# Patient Record
Sex: Male | Born: 1965 | Race: White | Hispanic: No | Marital: Single | Smoking: Never smoker
Health system: Southern US, Community
[De-identification: ages and names within clinical notes are randomized; demographics above are authoritative.]

## PROBLEM LIST (undated history)

## (undated) DIAGNOSIS — E7800 Pure hypercholesterolemia, unspecified: Secondary | ICD-10-CM

## (undated) DIAGNOSIS — I10 Essential (primary) hypertension: Secondary | ICD-10-CM

## (undated) DIAGNOSIS — I639 Cerebral infarction, unspecified: Secondary | ICD-10-CM

## (undated) HISTORY — PX: NO PAST SURGERIES: SHX2092

---

## 2014-06-26 ENCOUNTER — Observation Stay (HOSPITAL_BASED_OUTPATIENT_CLINIC_OR_DEPARTMENT_OTHER)
Admission: EM | Admit: 2014-06-26 | Discharge: 2014-06-29 | Disposition: A | Discharge status: Home / Self Care | Payer: Self-pay | Attending: Family Medicine | Admitting: Family Medicine

## 2014-06-26 ENCOUNTER — Encounter (HOSPITAL_BASED_OUTPATIENT_CLINIC_OR_DEPARTMENT_OTHER): Payer: Self-pay | Admitting: Emergency Medicine

## 2014-06-26 ENCOUNTER — Observation Stay (HOSPITAL_COMMUNITY): Payer: Self-pay

## 2014-06-26 DIAGNOSIS — I10 Essential (primary) hypertension: Principal | ICD-10-CM | POA: Insufficient documentation

## 2014-06-26 DIAGNOSIS — R079 Chest pain, unspecified: Secondary | ICD-10-CM | POA: Insufficient documentation

## 2014-06-26 DIAGNOSIS — E876 Hypokalemia: Secondary | ICD-10-CM | POA: Insufficient documentation

## 2014-06-26 DIAGNOSIS — I160 Hypertensive urgency: Secondary | ICD-10-CM | POA: Diagnosis present

## 2014-06-26 HISTORY — DX: Essential (primary) hypertension: I10

## 2014-06-26 HISTORY — DX: Pure hypercholesterolemia, unspecified: E78.00

## 2014-06-26 LAB — CBC WITH DIFFERENTIAL/PLATELET
Basophils Absolute: 0.1 10*3/uL (ref 0.0–0.1)
Basophils Relative: 1 % (ref 0–1)
EOS ABS: 0.1 10*3/uL (ref 0.0–0.7)
Eosinophils Relative: 1 % (ref 0–5)
HCT: 48.4 % (ref 39.0–52.0)
Hemoglobin: 17 g/dL (ref 13.0–17.0)
LYMPHS ABS: 2.9 10*3/uL (ref 0.7–4.0)
Lymphocytes Relative: 29 % (ref 12–46)
MCH: 30.2 pg (ref 26.0–34.0)
MCHC: 35.1 g/dL (ref 30.0–36.0)
MCV: 86 fL (ref 78.0–100.0)
Monocytes Absolute: 1 10*3/uL (ref 0.1–1.0)
Monocytes Relative: 10 % (ref 3–12)
NEUTROS ABS: 6.1 10*3/uL (ref 1.7–7.7)
NEUTROS PCT: 61 % (ref 43–77)
PLATELETS: 309 10*3/uL (ref 150–400)
RBC: 5.63 MIL/uL (ref 4.22–5.81)
RDW: 12.5 % (ref 11.5–15.5)
WBC: 10 10*3/uL (ref 4.0–10.5)

## 2014-06-26 LAB — COMPREHENSIVE METABOLIC PANEL
ALK PHOS: 89 U/L (ref 39–117)
ALT: 30 U/L (ref 0–53)
AST: 26 U/L (ref 0–37)
Albumin: 4.7 g/dL (ref 3.5–5.2)
Anion gap: 18 — ABNORMAL HIGH (ref 5–15)
BUN: 10 mg/dL (ref 6–23)
CHLORIDE: 97 meq/L (ref 96–112)
CO2: 25 mEq/L (ref 19–32)
Calcium: 10.3 mg/dL (ref 8.4–10.5)
Creatinine, Ser: 0.9 mg/dL (ref 0.50–1.35)
GFR calc non Af Amer: >90 mL/min (ref >90)
GLUCOSE: 105 mg/dL — AB (ref 70–99)
POTASSIUM: 3.4 meq/L — AB (ref 3.7–5.3)
SODIUM: 140 meq/L (ref 137–147)
Total Bilirubin: 0.4 mg/dL (ref 0.3–1.2)
Total Protein: 8.8 g/dL — ABNORMAL HIGH (ref 6.0–8.3)

## 2014-06-26 LAB — TROPONIN I: Troponin I: <0.3 ng/mL (ref <0.30)

## 2014-06-26 MED ORDER — LABETALOL HCL 5 MG/ML IV SOLN
20.0000 mg | Freq: Once | INTRAVENOUS | Status: AC
  Administered 2014-06-26: 20 mg via INTRAVENOUS
  Filled 2014-06-26: qty 4

## 2014-06-26 MED ORDER — ASPIRIN 81 MG PO CHEW
243.0000 mg | CHEWABLE_TABLET | Freq: Once | ORAL | Status: AC
  Administered 2014-06-26: 243 mg via ORAL
  Filled 2014-06-26: qty 3

## 2014-06-26 MED ORDER — POTASSIUM CHLORIDE CRYS ER 20 MEQ PO TBCR
40.0000 meq | EXTENDED_RELEASE_TABLET | Freq: Once | ORAL | Status: AC
  Administered 2014-06-26: 40 meq via ORAL
  Filled 2014-06-26: qty 2

## 2014-06-26 MED ORDER — HYDRALAZINE HCL 20 MG/ML IJ SOLN
10.0000 mg | INTRAMUSCULAR | Status: DC | PRN
  Administered 2014-06-26 – 2014-06-27 (×2): 10 mg via INTRAVENOUS
  Filled 2014-06-26 (×2): qty 1

## 2014-06-26 MED ORDER — AMLODIPINE BESYLATE 5 MG PO TABS
5.0000 mg | ORAL_TABLET | Freq: Every day | ORAL | Status: DC
  Administered 2014-06-27 (×2): 5 mg via ORAL
  Filled 2014-06-26 (×3): qty 1

## 2014-06-26 MED ORDER — HYDROCHLOROTHIAZIDE 12.5 MG PO CAPS
12.5000 mg | ORAL_CAPSULE | Freq: Every day | ORAL | Status: DC
  Administered 2014-06-27 (×2): 12.5 mg via ORAL
  Filled 2014-06-26 (×4): qty 1

## 2014-06-26 NOTE — ED Notes (Signed)
Pt given peanut butter crackers and coke per Dr. Ethelda Chick.

## 2014-06-26 NOTE — ED Provider Notes (Signed)
CSN: [id number]     Arrival date & time 06/26/14  1752 History  This chart was scribed for Doug Sou, MD by Modena Jansky, ED Scribe. This patient was seen in room MH03/MH03 and the patient's care was started at 6:31 PM.  Chief Complaint  Patient presents with  . Irregular Heart Beat   The history is provided by the patient and a parent. No language interpreter was used.   HPI Comments: Alex Grant is a 48 y.o. male with a hx of HTN who presents to the Emergency Department complaining of elevated blood pressure. He states that he did a health screening for work where his blood pressure was taken. He reports that his blood pressure was about 242/110. His blood pressure in the ED today is 250/120. He reports that he had pressure in his anterior chest 3 days ago, which lasted a few hours, resolved spontaneously. Onset at rest He is presently asymptomatic he denies irregular heart He states that he is supposed to be on medication for HTN but he was dropped by his PCP for financial reasons. He reports that he was on atenolol for HTN. He states that he takes 81 mg of aspirin daily. He states that he usually worries, which may be contributing to his high blood pressure. He reports no hx of drinking, smoking, or illegal drugs. His father states that he was informed that he has heart trouble at age 65. He reports no other family hx of heart problems. Pt states that he is allergic to amoxicillin.   Pt also complains of a moderate intermittent indigestion and burping sensation that started 3 days ago. He states that he has a slight pressure feeling on his chest also. Presently asymptomatic He reports no modifying factors for the sensation. No treatment prior to coming here   No PCP  Past Medical History  Diagnosis Date  . High cholesterol   . Hypertension    History reviewed. No pertinent past surgical history. No family history on file. History  Substance Use Topics  . Smoking status: Never  Smoker   . Smokeless tobacco: Not on file  . Alcohol Use: No    Review of Systems  Constitutional: Negative.   HENT: Negative.   Respiratory: Negative.   Cardiovascular: Positive for chest pain.  Gastrointestinal: Negative.   Musculoskeletal: Negative.   Skin: Negative.   Neurological: Negative.   Psychiatric/Behavioral: Negative.   All other systems reviewed and are negative.   Allergies  Amoxil  Home Medications   Prior to Admission medications   Not on File   BP 250/120  Pulse 103  Resp 20  Ht  (1.702 m)  Wt 166 lb (75.297 kg)  BMI 25.99 kg/m2  SpO2 98% Physical Exam  Nursing note and vitals reviewed. Constitutional: He appears well-developed and well-nourished.  HENT:  Head: Normocephalic and atraumatic.  Eyes: Conjunctivae are normal. Pupils are equal, round, and reactive to light.  Neck: Neck supple. No tracheal deviation present. No thyromegaly present.  Cardiovascular: Normal rate and regular rhythm.   No murmur heard. Pulmonary/Chest: Effort normal and breath sounds normal.  Abdominal: Soft. Bowel sounds are normal. He exhibits no distension. There is no tenderness.  Musculoskeletal: Normal range of motion. He exhibits no edema and no tenderness.  Neurological: He is alert. Coordination normal.  Skin: Skin is warm and dry. No rash noted.  Psychiatric: He has a normal mood and affect.    ED Course  Procedures (including critical care time) DIAGNOSTIC STUDIES:  Oxygen Saturation is 98% on RA, normal by my interpretation.    COORDINATION OF CARE: 6:35 PM- Pt advised of plan for treatment which includes labs and pt agrees.  Labs Review Labs Reviewed  COMPREHENSIVE METABOLIC PANEL - Abnormal; Notable for the following:    Potassium 3.4 (*)    Glucose, Bld 105 (*)    Total Protein 8.8 (*)    Anion gap 18 (*)    All other components within normal limits  CBC WITH DIFFERENTIAL  TROPONIN I    Imaging Review No results found.   EKG  Interpretation   Date/Time:  Wednesday June 26 2014 18:32:34 EDT Ventricular Rate:  91 PR Interval:  144 QRS Duration: 84 QT Interval:  386 QTC Calculation: 474 R Axis:   20 Text Interpretation:  Normal sinus rhythm Possible Left atrial enlargement  Left ventricular hypertrophy Abnormal ECG No old tracing to compare  Confirmed by Ethelda Chick  MD, Eileene Kisling [phone]) on 06/26/2014 6:52:03 PM     Results for orders placed during the hospital encounter of 06/26/14  CBC WITH DIFFERENTIAL      Result Value Ref Range   WBC 10.0  4.0 - 10.5 K/uL   RBC 5.63  4.22 - 5.81 MIL/uL   Hemoglobin 17.0  13.0 - 17.0 g/dL   HCT 60.4  54.0 - 98.1 %   MCV 86.0  78.0 - 100.0 fL   MCH 30.2  26.0 - 34.0 pg   MCHC 35.1  30.0 - 36.0 g/dL   RDW 19.1  47.8 - 29.5 %   Platelets 309  150 - 400 K/uL   Neutrophils Relative % 61  43 - 77 %   Neutro Abs 6.1  1.7 - 7.7 K/uL   Lymphocytes Relative 29  12 - 46 %   Lymphs Abs 2.9  0.7 - 4.0 K/uL   Monocytes Relative 10  3 - 12 %   Monocytes Absolute 1.0  0.1 - 1.0 K/uL   Eosinophils Relative 1  0 - 5 %   Eosinophils Absolute 0.1  0.0 - 0.7 K/uL   Basophils Relative 1  0 - 1 %   Basophils Absolute 0.1  0.0 - 0.1 K/uL  COMPREHENSIVE METABOLIC PANEL      Result Value Ref Range   Sodium 140  137 - 147 mEq/L   Potassium 3.4 (*) 3.7 - 5.3 mEq/L   Chloride 97  96 - 112 mEq/L   CO2 25  19 - 32 mEq/L   Glucose, Bld 105 (*) 70 - 99 mg/dL   BUN 10  6 - 23 mg/dL   Creatinine, Ser 6.21  0.50 - 1.35 mg/dL   Calcium 30.8  8.4 - 65.7 mg/dL   Total Protein 8.8 (*) 6.0 - 8.3 g/dL   Albumin 4.7  3.5 - 5.2 g/dL   AST 26  0 - 37 U/L   ALT 30  0 - 53 U/L   Alkaline Phosphatase 89  39 - 117 U/L   Total Bilirubin 0.4  0.3 - 1.2 mg/dL   GFR calc non Af Amer >90  >90 mL/min   GFR calc Af Amer >90  >90 mL/min   Anion gap 18 (*) 5 - 15  TROPONIN I      Result Value Ref Range   Troponin I <0.30  <0.30 ng/mL   No results found.  MDM  Heart score equals 3 825 pmRemains  asymptomatic after treatment with labetalol intravenously Spoke with Dr. Julian Reil plan 23 hour observation telemetry.  [facility] Final diagnoses:  None   diagnosis #1 hypertension  #2 chest pain #3 hypokalemia   I personally performed the services described in this documentation, which was scribed in my presence. The recorded information has been reviewed and considered.    Doug Sou, MD 06/26/14 2028

## 2014-06-26 NOTE — ED Notes (Addendum)
Pt states he feels his BP has been elevated over the last week-today feeling irregular HR-states he was advised by "insurance" personnel yesterday BP was 240s/120s-no BP meds x 6 yrs

## 2014-06-27 ENCOUNTER — Encounter (HOSPITAL_COMMUNITY): Payer: Self-pay | Admitting: Internal Medicine

## 2014-06-27 DIAGNOSIS — I160 Hypertensive urgency: Secondary | ICD-10-CM | POA: Diagnosis present

## 2014-06-27 DIAGNOSIS — R079 Chest pain, unspecified: Secondary | ICD-10-CM

## 2014-06-27 DIAGNOSIS — I10 Essential (primary) hypertension: Secondary | ICD-10-CM

## 2014-06-27 LAB — T4, FREE: Free T4: 1.19 ng/dL (ref 0.80–1.80)

## 2014-06-27 LAB — COMPREHENSIVE METABOLIC PANEL
ALK PHOS: 81 U/L (ref 39–117)
ALT: 25 U/L (ref 0–53)
AST: 21 U/L (ref 0–37)
Albumin: 4.1 g/dL (ref 3.5–5.2)
Anion gap: 14 (ref 5–15)
BUN: 9 mg/dL (ref 6–23)
CO2: 24 meq/L (ref 19–32)
Calcium: 9.2 mg/dL (ref 8.4–10.5)
Chloride: 99 mEq/L (ref 96–112)
Creatinine, Ser: 0.73 mg/dL (ref 0.50–1.35)
GLUCOSE: 101 mg/dL — AB (ref 70–99)
POTASSIUM: 3.3 meq/L — AB (ref 3.7–5.3)
Sodium: 137 mEq/L (ref 137–147)
Total Bilirubin: 0.4 mg/dL (ref 0.3–1.2)
Total Protein: 7.7 g/dL (ref 6.0–8.3)

## 2014-06-27 LAB — RAPID URINE DRUG SCREEN, HOSP PERFORMED
Amphetamines: NOT DETECTED
BENZODIAZEPINES: NOT DETECTED
Barbiturates: NOT DETECTED
COCAINE: NOT DETECTED
Opiates: NOT DETECTED
Tetrahydrocannabinol: NOT DETECTED

## 2014-06-27 LAB — T3, FREE: T3, Free: 3.8 pg/mL (ref 2.3–4.2)

## 2014-06-27 LAB — CBC
HEMATOCRIT: 45.5 % (ref 39.0–52.0)
Hemoglobin: 16.1 g/dL (ref 13.0–17.0)
MCH: 30.5 pg (ref 26.0–34.0)
MCHC: 35.4 g/dL (ref 30.0–36.0)
MCV: 86.2 fL (ref 78.0–100.0)
Platelets: 260 10*3/uL (ref 150–400)
RBC: 5.28 MIL/uL (ref 4.22–5.81)
RDW: 12.4 % (ref 11.5–15.5)
WBC: 9.1 10*3/uL (ref 4.0–10.5)

## 2014-06-27 LAB — LIPID PANEL
Cholesterol: 239 mg/dL — ABNORMAL HIGH (ref 0–200)
HDL: 51 mg/dL (ref >39)
LDL Cholesterol: 142 mg/dL — ABNORMAL HIGH (ref 0–99)
Total CHOL/HDL Ratio: 4.7 RATIO
Triglycerides: 230 mg/dL — ABNORMAL HIGH (ref <150)
VLDL: 46 mg/dL — ABNORMAL HIGH (ref 0–40)

## 2014-06-27 LAB — GLUCOSE, CAPILLARY: GLUCOSE-CAPILLARY: 130 mg/dL — AB (ref 70–99)

## 2014-06-27 LAB — TSH: TSH: 4.96 u[IU]/mL — AB (ref 0.350–4.500)

## 2014-06-27 LAB — TROPONIN I: Troponin I: <0.3 ng/mL (ref <0.30)

## 2014-06-27 MED ORDER — METOCLOPRAMIDE HCL 5 MG/ML IJ SOLN
10.0000 mg | Freq: Once | INTRAMUSCULAR | Status: DC
  Filled 2014-06-27 (×2): qty 2

## 2014-06-27 MED ORDER — POTASSIUM CHLORIDE CRYS ER 20 MEQ PO TBCR
20.0000 meq | EXTENDED_RELEASE_TABLET | Freq: Once | ORAL | Status: AC
  Administered 2014-06-27: 20 meq via ORAL
  Filled 2014-06-27: qty 1

## 2014-06-27 MED ORDER — TRAMADOL HCL 50 MG PO TABS
50.0000 mg | ORAL_TABLET | Freq: Once | ORAL | Status: AC
  Administered 2014-06-27: 50 mg via ORAL
  Filled 2014-06-27: qty 1

## 2014-06-27 MED ORDER — ONDANSETRON HCL 4 MG/2ML IJ SOLN: 4.0000 mg | Freq: Four times a day (QID) | INTRAMUSCULAR | Status: DC | PRN

## 2014-06-27 MED ORDER — ACETAMINOPHEN 325 MG PO TABS
650.0000 mg | ORAL_TABLET | Freq: Four times a day (QID) | ORAL | Status: DC | PRN
  Administered 2014-06-27 – 2014-06-28 (×4): 650 mg via ORAL
  Filled 2014-06-27 (×4): qty 2

## 2014-06-27 MED ORDER — ENOXAPARIN SODIUM 40 MG/0.4ML ~~LOC~~ SOLN
40.0000 mg | SUBCUTANEOUS | Status: DC
  Administered 2014-06-28: 40 mg via SUBCUTANEOUS
  Filled 2014-06-27 (×2): qty 0.4

## 2014-06-27 MED ORDER — DIPHENHYDRAMINE HCL 50 MG/ML IJ SOLN
25.0000 mg | Freq: Once | INTRAMUSCULAR | Status: DC
  Filled 2014-06-27 (×2): qty 1

## 2014-06-27 MED ORDER — SODIUM CHLORIDE 0.9 % IJ SOLN
3.0000 mL | Freq: Two times a day (BID) | INTRAMUSCULAR | Status: DC
  Administered 2014-06-28: 3 mL via INTRAVENOUS

## 2014-06-27 MED ORDER — ENOXAPARIN SODIUM 40 MG/0.4ML ~~LOC~~ SOLN
40.0000 mg | SUBCUTANEOUS | Status: DC
  Administered 2014-06-27: 40 mg via SUBCUTANEOUS
  Filled 2014-06-27: qty 0.4

## 2014-06-27 MED ORDER — ACETAMINOPHEN 650 MG RE SUPP: 650.0000 mg | Freq: Four times a day (QID) | RECTAL | Status: DC | PRN

## 2014-06-27 MED ORDER — KETOROLAC TROMETHAMINE 30 MG/ML IJ SOLN
30.0000 mg | Freq: Once | INTRAMUSCULAR | Status: DC
  Filled 2014-06-27 (×2): qty 1

## 2014-06-27 MED ORDER — SODIUM CHLORIDE 0.9 % IJ SOLN
3.0000 mL | Freq: Two times a day (BID) | INTRAMUSCULAR | Status: DC
  Administered 2014-06-27 – 2014-06-29 (×5): 3 mL via INTRAVENOUS

## 2014-06-27 MED ORDER — ONDANSETRON HCL 4 MG PO TABS: 4.0000 mg | ORAL_TABLET | Freq: Four times a day (QID) | ORAL | Status: DC | PRN

## 2014-06-27 NOTE — Progress Notes (Signed)
Patient seen and evaluated earlier this AM by my associate. Please refer to his H and P for details regarding assessment and plan.  Awaiting lab work metanephrines and aldosterone and renin.  Will reassess next am.  Penny Pia

## 2014-06-27 NOTE — Progress Notes (Signed)
UR Completed.  [phone]  

## 2014-06-27 NOTE — H&P (Signed)
[facility] History and Physical  Alex Grant ZOX:[id number] DOB: [birth year] DOA: 06/26/2014  Referring physician: Patient was transferred from [facility]. PCP: No PCP Per Patient   Chief Complaint: Palpitations.  HPI: Alex Grant is a 48 y.o. male with history of hypertension who has not been taking his medications for last 6 years presents to the ER with complaints of palpitation. Patient has had health screening done at his workplace 2 days ago and was found to have elevated blood pressure at with systolic in the 250s. Patient was advised to go to his physician. Patient yesterday while working for had transient palpitation which lasted less than one minute with no associated chest pain shortness of breath. Patient came to the ER at the [facility] and was found to have elevated blood pressure with systolic blood pressure in the 250s. Patient was given IV labetalol and transferred to [facility] for further management. Patient's blood pressure improved with initial dose of IV labetalol but on arrival patient's blood pressure again was in the 200 systolic and was given IV hydralazine one dose. Patient states he has history of hypertension and was on atenolol and HCTZ and has not been taking it since 2009 after his PCP had discharged him from the practice. Patient denies any headache visual symptoms nausea vomiting abdominal pain focal deficits. Chest the patient denies any chest pain shortness of breath. Patient has not had any palpitations since arrival in the ER. EKG shows sinus rhythm with LVH. Cardiac markers were negative.  Review of Systems: As presented in the history of presenting illness, rest negative.  Past Medical History  Diagnosis Date  . High cholesterol   . Hypertension    Past Surgical History  Procedure Laterality Date  . No past surgeries     Social History:  reports that he has never smoked. He does not have any smokeless tobacco history on file.  He reports that he does not drink alcohol or use illicit drugs. Where does patient live home. Can patient participate in ADLs? Yes.  Allergies  Allergen Reactions  . Amoxil [Amoxicillin] Hives    Family History:  Family History  Problem Relation Age of Onset  . Diabetes Mellitus II Father   . CAD Other       Prior to Admission medications   Not on File    Physical Exam: Filed Vitals:   06/26/14 2100 06/26/14 2130 06/26/14 2200 06/26/14 2320  BP: 178/91 197/98 179/92 206/99  Pulse: 77 88 81 83  Temp:    98.7 F (37.1 C)  TempSrc:    Oral  Resp:    18  Height:    5' 7.5" (1.715 m)  Weight:    72.802 kg (160 lb 8 oz)  SpO2: 98% 99% 97% 94%     General:  Well-developed and nourished.  Eyes: Anicteric no pallor.  ENT: No discharge from the ears eyes nose mouth.  Neck: No mass felt. No JVD appreciated.  Cardiovascular: S1-S2 heard.  Respiratory: No rhonchi or crepitations.  Abdomen: Soft nontender bowel sounds present. No guarding rigidity.  Skin: No rash.  Musculoskeletal: No edema.  Psychiatric: Appears normal.  Neurologic: Alert awake oriented to time place and person. Moves all extremities 5 x 5. No facial asymmetry. Tongue is midline. PERRLA positive.  Labs on Admission:  Basic Metabolic Panel:  Recent Labs Lab 06/26/14 1830  NA 140  K 3.4*  CL 97  CO2 25  GLUCOSE 105*  BUN 10  CREATININE  0.90  CALCIUM 10.3   Liver Function Tests:  Recent Labs Lab 06/26/14 1830  AST 26  ALT 30  ALKPHOS 89  BILITOT 0.4  PROT 8.8*  ALBUMIN 4.7   No results found for this basename: LIPASE, AMYLASE,  in the last 168 hours No results found for this basename: AMMONIA,  in the last 168 hours CBC:  Recent Labs Lab 06/26/14 1830  WBC 10.0  NEUTROABS 6.1  HGB 17.0  HCT 48.4  MCV 86.0  PLT 309   Cardiac Enzymes:  Recent Labs Lab 06/26/14 1830  TROPONINI <0.30    BNP (last 3 results) No results found for this basename: PROBNP,  in the last  8760 hours CBG: No results found for this basename: GLUCAP,  in the last 168 hours  Radiological Exams on Admission: No results found.  EKG: Independently reviewed. Normal sinus rhythm with LVH.  Assessment/Plan Principal Problem:   Hypertensive urgency   1. Hypertensive urgency - at this time I have placed patient on hydralazine 10 mg IV every 4 hourly when necessary for systolic blood pressure more than 180 and have added Norvasc 5 mg by mouth daily with HCTZ. Since patient has mild hypokalemia we will check aldosterone renin ratio to rule out hyperaldosteronism as the cause for hypertension. Since patient also had palpitation check metanephrines. Closely follow blood pressure trend. Patient advised he will need close followup with his primary care physician. 2. Hypokalemia - potassium replacement has been ordered and see #1. 3. Palpitations - no further episode after admission. Closely observe in telemetry. Check TSH and free T4 and T3.    Code Status: Full code.  Family Communication: None.  Disposition Plan: Admit for observation.    Emely Fahy N. [facility] Pager [phone].  If 7PM-7AM, please contact night-coverage [email or web address] Password [email or web address] 06/27/2014, 12:15 AM

## 2014-06-27 NOTE — Progress Notes (Signed)
RN called in room by pt. Pt c/o of feeling dizzy & nauseous. Pt was up using restroom when he felt hot & sweaty. Pt said he went to urinate but then he felt like he had to have a bowel movement so he did. On his way back to the bed pt felt like he was going to pass out & was stumbling back to bed. Pt said his arms felt like they were vibrating. VS stable & charted. CBG was 130. Np on call notified.  No new orders given at this time will continue to monitor the pt. Sanda Linger, RN

## 2014-06-27 NOTE — Care Management (Signed)
1527 06-27-14 Pt provided information for the [facility]. Pt to call an make appointment. CM did discuss that generic medications will be cheaper at [facility]. No further needs from CM at this time. Gala Lewandowsky, RN,BSN [phone]

## 2014-06-28 DIAGNOSIS — E876 Hypokalemia: Secondary | ICD-10-CM | POA: Diagnosis present

## 2014-06-28 MED ORDER — TRAMADOL HCL 50 MG PO TABS
50.0000 mg | ORAL_TABLET | Freq: Four times a day (QID) | ORAL | Status: DC | PRN
  Administered 2014-06-28 (×2): 50 mg via ORAL
  Filled 2014-06-28 (×2): qty 1

## 2014-06-28 MED ORDER — AMLODIPINE BESYLATE 10 MG PO TABS
10.0000 mg | ORAL_TABLET | Freq: Every day | ORAL | Status: DC
  Administered 2014-06-28 – 2014-06-29 (×2): 10 mg via ORAL
  Filled 2014-06-28 (×3): qty 1

## 2014-06-28 MED ORDER — MENTHOL 3 MG MT LOZG
1.0000 | LOZENGE | OROMUCOSAL | Status: DC | PRN
  Administered 2014-06-28: 3 mg via ORAL
  Filled 2014-06-28: qty 9

## 2014-06-28 MED ORDER — LABETALOL HCL 5 MG/ML IV SOLN
5.0000 mg | Freq: Four times a day (QID) | INTRAVENOUS | Status: DC | PRN
  Administered 2014-06-28: 5 mg via INTRAVENOUS
  Filled 2014-06-28: qty 4

## 2014-06-28 MED ORDER — HYDROCHLOROTHIAZIDE 25 MG PO TABS
25.0000 mg | ORAL_TABLET | Freq: Every day | ORAL | Status: DC
  Administered 2014-06-28 – 2014-06-29 (×2): 25 mg via ORAL
  Filled 2014-06-28 (×2): qty 1

## 2014-06-28 MED ORDER — POTASSIUM CHLORIDE CRYS ER 20 MEQ PO TBCR
40.0000 meq | EXTENDED_RELEASE_TABLET | Freq: Once | ORAL | Status: AC
  Administered 2014-06-28: 40 meq via ORAL
  Filled 2014-06-28: qty 2

## 2014-06-28 NOTE — Progress Notes (Signed)
Utilization review completed.  

## 2014-06-28 NOTE — Progress Notes (Addendum)
[facility] PROGRESS NOTE  Xaden Kaufman ZOX:[id number] DOB: 1966-09-05 DOA: 06/26/2014 PCP: No PCP Per Patient  Assessment/Plan: 1. HTN urgency - Change prn antihypertension medication from hydralazine to labetalol. I suspect patient was having side effects to the hydralazine (HA's) as he reported worsening HA after receiving the medications - addendum: Will increase oral antihypertensive medication regimen given persistent elevated BP readings > 160.  Increased amlodipine and hctz dose  2. Hypokalemia - Will replace orally and reassess next am.  Code Status: full Family Communication: None at bedside Disposition Plan: Telemetry   Consultants:  none  Procedures:  none  Antibiotics:  None  HPI/Subjective: Pt has no new complaints. No acute issues reported overnight.  Objective: Filed Vitals:   06/28/14 0818  BP: 186/96  Pulse: 88  Temp: 98 F (36.7 C)  Resp:    No intake or output data in the 24 hours ending 06/28/14 0945 Filed Weights   06/26/14 1812 06/26/14 2320 06/28/14 0615  Weight: 75.297 kg (166 lb) 72.802 kg (160 lb 8 oz) 77.823 kg (171 lb 9.1 oz)    Exam:   General:  Pt in nad, alert and awake  Cardiovascular: rrr, no mrg  Respiratory: cta bl, no wheezes  Abdomen: soft, NT, ND  Musculoskeletal: no cyanosis or clubbing   Data Reviewed: Basic Metabolic Panel:  Recent Labs Lab 06/26/14 1830 06/27/14 0103  NA 140 137  K 3.4* 3.3*  CL 97 99  CO2 25 24  GLUCOSE 105* 101*  BUN 10 9  CREATININE 0.90 0.73  CALCIUM 10.3 9.2   Liver Function Tests:  Recent Labs Lab 06/26/14 1830 06/27/14 0103  AST 26 21  ALT 30 25  ALKPHOS 89 81  BILITOT 0.4 0.4  PROT 8.8* 7.7  ALBUMIN 4.7 4.1   No results found for this basename: LIPASE, AMYLASE,  in the last 168 hours No results found for this basename: AMMONIA,  in the last 168 hours CBC:  Recent Labs Lab 06/26/14 1830 06/27/14 0103  WBC 10.0 9.1  NEUTROABS 6.1  --   HGB 17.0 16.1   HCT 48.4 45.5  MCV 86.0 86.2  PLT 309 260   Cardiac Enzymes:  Recent Labs Lab 06/26/14 1830 06/27/14 0103  TROPONINI <0.30 <0.30   BNP (last 3 results) No results found for this basename: PROBNP,  in the last 8760 hours CBG:  Recent Labs Lab 06/27/14 2214  GLUCAP 130*    No results found for this or any previous visit (from the past 240 hour(s)).   Studies: Dg Chest Port 1 View  06/27/2014   INSERT IS CLINICAL DATA: Hypertension  EXAM: PORTABLE CHEST - 1 VIEW  COMPARISON:  None.  FINDINGS: Enlarged cardiac silhouette. Mild aortic tortuosity. Lungs clear. No pleural effusion or pneumothorax. No acute osseous finding.  IMPRESSION: Enlarged cardiac silhouette.  No focal consolidation.   Electronically Signed   By: Jearld Lesch M.D.   On: 06/27/2014 00:22    Scheduled Meds: . amLODipine  10 mg Oral Daily  . diphenhydrAMINE  25 mg Intravenous Once  . enoxaparin (LOVENOX) injection  40 mg Subcutaneous Q24H  . hydrochlorothiazide  25 mg Oral Daily  . metoCLOPramide (REGLAN) injection  10 mg Intravenous Once  . sodium chloride  3 mL Intravenous Q12H  . sodium chloride  3 mL Intravenous Q12H   Continuous Infusions:   Principal Problem:   Hypertensive urgency    Time spent: > 35 minutes    Penny Pia  [facility] Pager [phone]  If 7PM-7AM, please contact night-coverage at [email or web address], password [email or web address] 06/28/2014, 9:45 AM  LOS: 2 days

## 2014-06-29 LAB — POTASSIUM: Potassium: 3.6 mEq/L — ABNORMAL LOW (ref 3.7–5.3)

## 2014-06-29 MED ORDER — AMLODIPINE BESYLATE 10 MG PO TABS: 10.0000 mg | ORAL_TABLET | Freq: Every day | ORAL | Status: DC

## 2014-06-29 MED ORDER — HYDROCHLOROTHIAZIDE 25 MG PO TABS: 25.0000 mg | ORAL_TABLET | Freq: Every day | ORAL | Status: DC

## 2014-06-29 NOTE — Discharge Summary (Signed)
Physician Discharge Summary  Alex Grant ZOX:[id number] DOB: September 29, 1966 DOA: 06/26/2014  PCP: No PCP Per Patient  Admit date: 06/26/2014 Discharge date: 06/29/2014  Time spent: 35 minutes  Recommendations for Outpatient Follow-up:  1.  Please reassess blood pressures and adjust antihypertensive medications 2. Consider workup for secondary causes of hypertension 3. Reassess potassium levels  Discharge Diagnoses:  Principal Problem:   Hypertensive urgency Active Problems:   Hypokalemia   Discharge Condition: Stable  Diet recommendation: Low sodium heart healthy  Filed Weights   06/26/14 2320 06/28/14 0615 06/29/14 0421  Weight: 72.802 kg (160 lb 8 oz) 77.823 kg (171 lb 9.1 oz) 71.85 kg (158 lb 6.4 oz)    History of present illness:  48 year old with hypertension who had not been on his medications in the last 6 years who presented to the hospital with hypertensive urgency.  Hospital Course:  Hypertensive urgency -Resolved on amlodipine and hydrochlorothiazide. We'll continue this regimen. Recommended patient followup with primary care physician within the next 2 weeks for further evaluation recommendations.  Hypokalemia - Replace orally while patient in house. Suspect potassium levels will continue to improve with improved oral intake. On day of discharge was mildly hypokalemic at 3.6.  Procedures:  None  Consultations:  None  Discharge Exam: Filed Vitals:   06/29/14 0936  BP: 160/88  Pulse:   Temp:   Resp:     General: Patient in no acute distress, alert and awake Cardiovascular: Regular rate and rhythm, no murmurs or rubs Respiratory: Clear to auscultation bilaterally, no wheezes  Discharge Instructions You were cared for by a hospitalist during your hospital stay. If you have any questions about your discharge medications or the care you received while you were in the hospital after you are discharged, you can call the unit and asked to speak with the  hospitalist on call if the hospitalist that took care of you is not available. Once you are discharged, your primary care physician will handle any further medical issues. Please note that NO REFILLS for any discharge medications will be authorized once you are discharged, as it is imperative that you return to your primary care physician (or establish a relationship with a primary care physician if you do not have one) for your aftercare needs so that they can reassess your need for medications and monitor your lab values.  Discharge Instructions   Call MD for:  difficulty breathing, headache or visual disturbances    Complete by:  As directed      Call MD for:  temperature >100.4    Complete by:  As directed      Diet - low sodium heart healthy    Complete by:  As directed      Discharge instructions    Complete by:  As directed   Patient has been recommended to f/u with his primary care physician within the next 2 weeks.     Increase activity slowly    Complete by:  As directed           Current Discharge Medication List    START taking these medications   Details  amLODipine (NORVASC) 10 MG tablet Take 1 tablet (10 mg total) by mouth daily. Qty: 30 tablet, Refills: 0    hydrochlorothiazide (HYDRODIURIL) 25 MG tablet Take 1 tablet (25 mg total) by mouth daily. Qty: 30 tablet, Refills: 0      CONTINUE these medications which have NOT CHANGED   Details  aspirin EC 81 MG tablet Take 81  mg by mouth daily.       Allergies  Allergen Reactions  . Amoxil [Amoxicillin] Hives   Follow-up Information   Schedule an appointment as soon as possible for a visit with [facility]    .   Contact information:   [address] [place] [address] [phone]       The results of significant diagnostics from this hospitalization (including imaging, microbiology, ancillary and laboratory) are listed below for reference.    Significant Diagnostic  Studies: Dg Chest Port 1 View  06/27/2014   INSERT IS CLINICAL DATA: Hypertension  EXAM: PORTABLE CHEST - 1 VIEW  COMPARISON:  None.  FINDINGS: Enlarged cardiac silhouette. Mild aortic tortuosity. Lungs clear. No pleural effusion or pneumothorax. No acute osseous finding.  IMPRESSION: Enlarged cardiac silhouette.  No focal consolidation.   Electronically Signed   By: Jearld Lesch M.D.   On: 06/27/2014 00:22    Microbiology: No results found for this or any previous visit (from the past 240 hour(s)).   Labs: Basic Metabolic Panel:  Recent Labs Lab 06/26/14 1830 06/27/14 0103 06/29/14 0353  NA 140 137  --   K 3.4* 3.3* 3.6*  CL 97 99  --   CO2 25 24  --   GLUCOSE 105* 101*  --   BUN 10 9  --   CREATININE 0.90 0.73  --   CALCIUM 10.3 9.2  --    Liver Function Tests:  Recent Labs Lab 06/26/14 1830 06/27/14 0103  AST 26 21  ALT 30 25  ALKPHOS 89 81  BILITOT 0.4 0.4  PROT 8.8* 7.7  ALBUMIN 4.7 4.1   No results found for this basename: LIPASE, AMYLASE,  in the last 168 hours No results found for this basename: AMMONIA,  in the last 168 hours CBC:  Recent Labs Lab 06/26/14 1830 06/27/14 0103  WBC 10.0 9.1  NEUTROABS 6.1  --   HGB 17.0 16.1  HCT 48.4 45.5  MCV 86.0 86.2  PLT 309 260   Cardiac Enzymes:  Recent Labs Lab 06/26/14 1830 06/27/14 0103  TROPONINI <0.30 <0.30   BNP: BNP (last 3 results) No results found for this basename: PROBNP,  in the last 8760 hours CBG:  Recent Labs Lab 06/27/14 2214  GLUCAP 130*       Signed:  Penny Pia  [facility] 06/29/2014, 9:53 AM

## 2014-06-30 LAB — METANEPHRINES, PLASMA
Metanephrine, Free: 27 pg/mL (ref <=57)
Normetanephrine, Free: 62 pg/mL (ref <=148)
TOTAL METANEPHRINES-PLASMA: 89 pg/mL (ref <=205)

## 2014-07-04 LAB — ALDOSTERONE + RENIN ACTIVITY W/ RATIO
ALDO / PRA Ratio: 0.9 Ratio (ref 0.9–28.9)
ALDOSTERONE: 3 ng/dL
PRA LC/MS/MS: 3.38 ng/mL/h (ref 0.25–5.82)

## 2014-07-08 ENCOUNTER — Inpatient Hospital Stay: Payer: Self-pay | Admitting: Family Medicine

## 2016-04-01 IMAGING — CR DG CHEST 1V PORT
1 series · 1 of 1 positions shown · non-contrast
Comparison: None.

INSERT IS CLINICAL DATA:
Hypertension

EXAM:
PORTABLE CHEST - 1 VIEW

[AP]
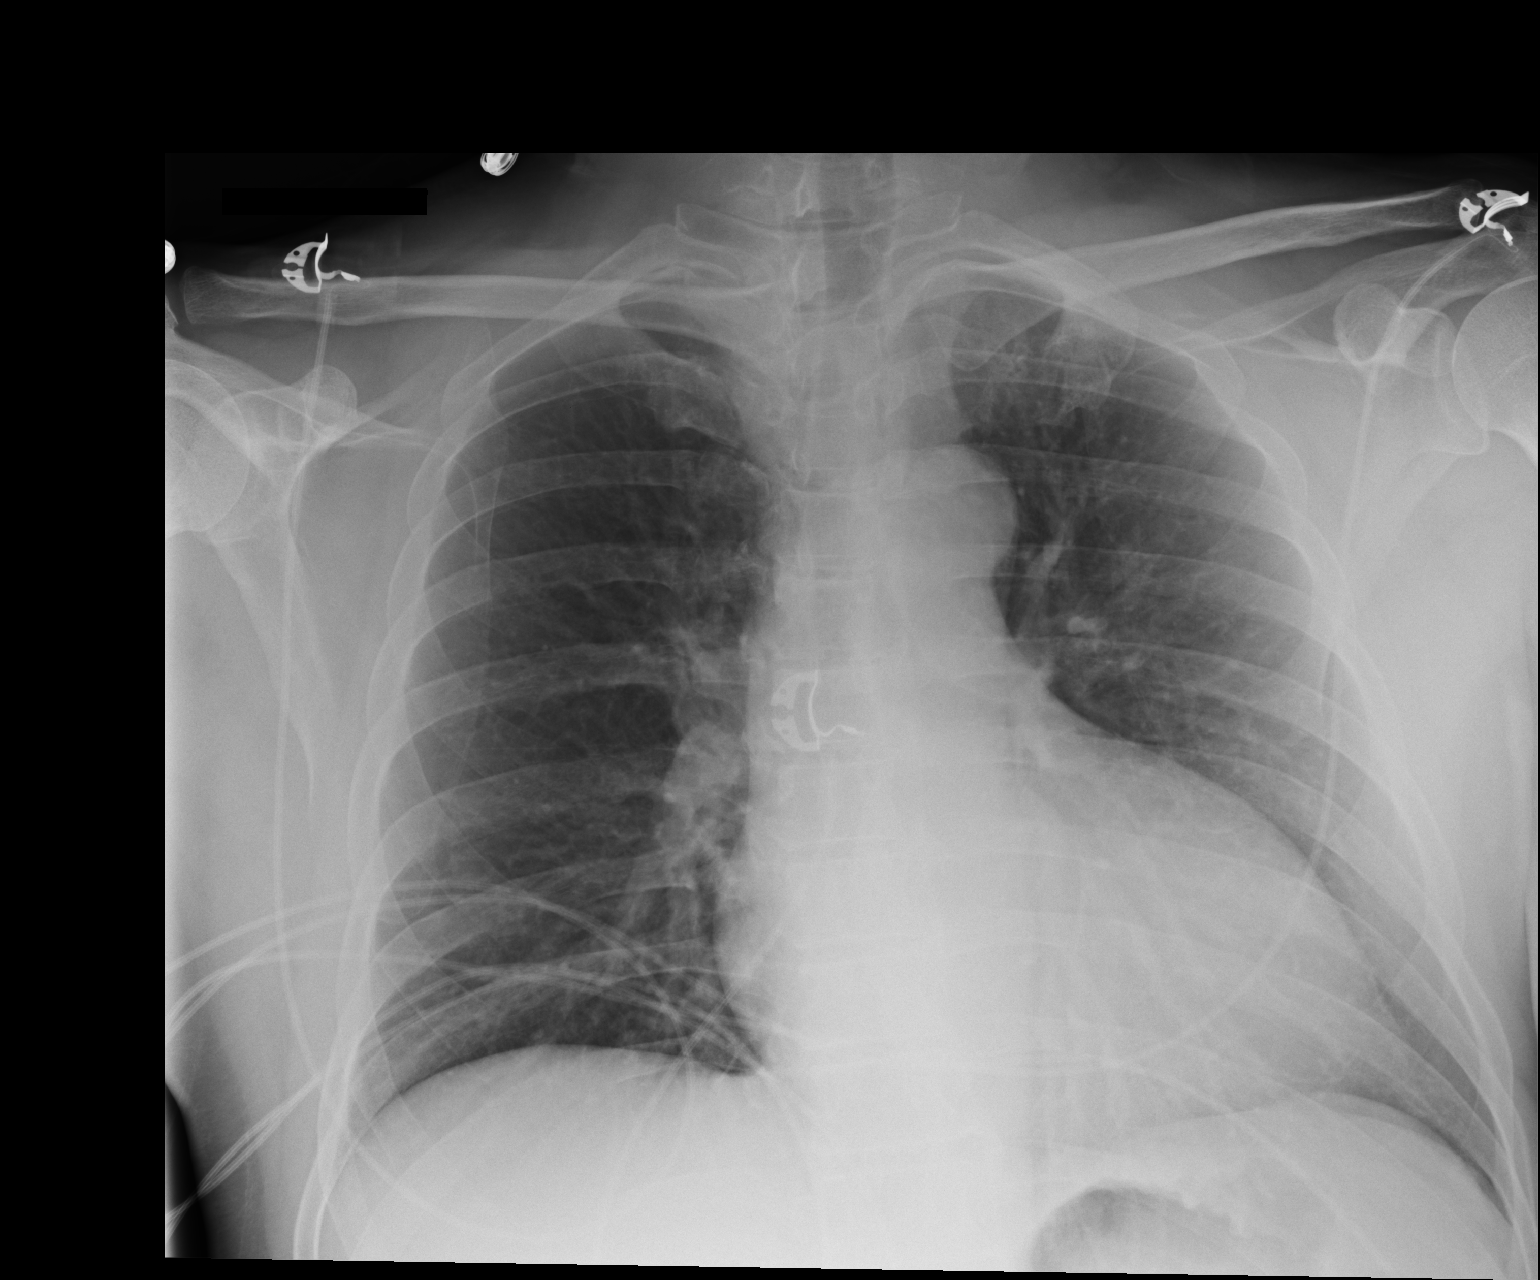

[1 of 1 positions shown; findings below may reference images not displayed]

FINDINGS: Enlarged cardiac silhouette. Mild aortic tortuosity. Lungs clear. No
pleural effusion or pneumothorax. No acute osseous finding.
IMPRESSION: Enlarged cardiac silhouette.  No focal consolidation.

## 2016-04-29 DIAGNOSIS — I10 Essential (primary) hypertension: Secondary | ICD-10-CM | POA: Diagnosis not present

## 2016-04-29 DIAGNOSIS — E559 Vitamin D deficiency, unspecified: Secondary | ICD-10-CM | POA: Diagnosis not present

## 2016-05-06 DIAGNOSIS — E785 Hyperlipidemia, unspecified: Secondary | ICD-10-CM | POA: Diagnosis not present

## 2016-05-06 DIAGNOSIS — I10 Essential (primary) hypertension: Secondary | ICD-10-CM | POA: Diagnosis not present

## 2016-05-06 DIAGNOSIS — Z125 Encounter for screening for malignant neoplasm of prostate: Secondary | ICD-10-CM | POA: Diagnosis not present

## 2016-05-06 DIAGNOSIS — E559 Vitamin D deficiency, unspecified: Secondary | ICD-10-CM | POA: Diagnosis not present

## 2016-10-28 ENCOUNTER — Ambulatory Visit (HOSPITAL_COMMUNITY)
Admission: EM | Admit: 2016-10-28 | Discharge: 2016-10-28 | Disposition: A | Discharge status: Home / Self Care | Payer: Self-pay | Attending: Emergency Medicine | Admitting: Emergency Medicine

## 2016-10-28 ENCOUNTER — Encounter (HOSPITAL_COMMUNITY): Payer: Self-pay | Admitting: Emergency Medicine

## 2016-10-28 DIAGNOSIS — B349 Viral infection, unspecified: Secondary | ICD-10-CM

## 2016-10-28 NOTE — Discharge Instructions (Signed)
You have a viral infection. This is probably not flu. Drink lots of fluids and get plenty of rest. You can take Tylenol and ibuprofen as needed for fever and body aches. Do not take more than 6 extra strength Tylenol tablets in a 24-hour period. Follow-up as needed.

## 2016-10-28 NOTE — ED Provider Notes (Signed)
[facility]    CSN: [id number] Arrival date & time: 10/28/16  1733     History   Chief Complaint Chief Complaint  Patient presents with  . Influenza    HPI Alex Grant is a 51 y.o. male.   HPI  He is a 51 year old man here for evaluation of fever and body aches. He states he was sick last week with a stomach bug. His symptoms resolved and he was able to go to work on Monday and Tuesday. Yesterday evening, he started feeling a little off. This morning, he woke up with fever and body aches that have been getting progressively worse over the course of the day. He denies any nasal congestion, rhinorrhea, sore throat. No cough or shortness of breath. No nausea, vomiting, or diarrhea.  Past Medical History:  Diagnosis Date  . High cholesterol   . Hypertension     Patient Active Problem List   Diagnosis Date Noted  . Hypokalemia 06/28/2014  . Hypertensive urgency 06/27/2014  . Chest pain 06/26/2014    Past Surgical History:  Procedure Laterality Date  . NO PAST SURGERIES         Home Medications    Prior to Admission medications   Medication Sig Start Date End Date Taking? Authorizing Provider  amLODipine (NORVASC) 10 MG tablet Take 1 tablet (10 mg total) by mouth daily. 06/29/14  Yes Penny Piarlando Vega, MD  aspirin EC 81 MG tablet Take 81 mg by mouth daily.   Yes Historical Provider, MD  hydrochlorothiazide (HYDRODIURIL) 25 MG tablet Take 1 tablet (25 mg total) by mouth daily. 06/29/14  Yes Penny Piarlando Vega, MD  pravastatin (PRAVACHOL) 10 MG tablet Take 10 mg by mouth daily.   Yes Historical Provider, MD    Family History Family History  Problem Relation Age of Onset  . Diabetes Mellitus II Father   . CAD Other     Social History Social History  Substance Use Topics  . Smoking status: Never Smoker  . Smokeless tobacco: Never Used  . Alcohol use No     Allergies   Amoxil [amoxicillin]   Review of Systems Review of Systems As in history of present  illness  Physical Exam Triage Vital Signs ED Triage Vitals [10/28/16 1743]  Enc Vitals Group     BP 156/83     Pulse Rate 107     Resp      Temp 101.2 F (38.4 C)     Temp Source Oral     SpO2 98 %     Weight      Height      Head Circumference      Peak Flow      Pain Score 5     Pain Loc      Pain Edu?      Excl. in GC?    No data found.   Updated Vital Signs BP 156/83 (BP Location: Left Arm)   Pulse 107   Temp 101.2 F (38.4 C) (Oral)   SpO2 98%   Visual Acuity Right Eye Distance:   Left Eye Distance:   Bilateral Distance:    Right Eye Near:   Left Eye Near:    Bilateral Near:     Physical Exam  Constitutional: He is oriented to person, place, and time. He appears well-developed and well-nourished. No distress.  HENT:  Nose: Nose normal.  Mouth/Throat: Oropharynx is clear and moist. No oropharyngeal exudate.  Bilateral cerumen impaction  Neck: Neck supple.  Cardiovascular: Regular rhythm and normal heart sounds.   No murmur heard. Slight tachycardia  Pulmonary/Chest: Effort normal and breath sounds normal. No respiratory distress. He has no wheezes. He has no rales.  Lymphadenopathy:    He has no cervical adenopathy.  Neurological: He is alert and oriented to person, place, and time.     UC Treatments / Results  Labs (all labs ordered are listed, but only abnormal results are displayed) Labs Reviewed - No data to display  EKG  EKG Interpretation None       Radiology No results found.  Procedures Procedures (including critical care time)  Medications Ordered in UC Medications - No data to display   Initial Impression / Assessment and Plan / UC Course  I have reviewed the triage vital signs and the nursing notes.  Pertinent labs & imaging results that were available during my care of the patient were reviewed by me and considered in my medical decision making (see chart for details).  Clinical Course     No sign of bacterial  infection. Doubt influenza. Symptomatic treatment with Tylenol and ibuprofen. Return precautions reviewed. Work note provided.  Final Clinical Impressions(s) / UC Diagnoses   Final diagnoses:  Viral syndrome    New Prescriptions New Prescriptions   No medications on file     Charm Rings, MD 10/28/16 1610

## 2016-10-28 NOTE — ED Triage Notes (Signed)
Pt has been suffering from a fever and body aches since yesterday.  Pt reports a low fever this morning.

## 2016-11-08 DIAGNOSIS — Z125 Encounter for screening for malignant neoplasm of prostate: Secondary | ICD-10-CM | POA: Diagnosis not present

## 2016-11-08 DIAGNOSIS — R739 Hyperglycemia, unspecified: Secondary | ICD-10-CM | POA: Diagnosis not present

## 2016-11-08 DIAGNOSIS — I10 Essential (primary) hypertension: Secondary | ICD-10-CM | POA: Diagnosis not present

## 2016-11-12 DIAGNOSIS — I10 Essential (primary) hypertension: Secondary | ICD-10-CM | POA: Diagnosis not present

## 2016-11-12 DIAGNOSIS — H6123 Impacted cerumen, bilateral: Secondary | ICD-10-CM | POA: Diagnosis not present

## 2016-11-12 DIAGNOSIS — E785 Hyperlipidemia, unspecified: Secondary | ICD-10-CM | POA: Diagnosis not present

## 2017-05-09 DIAGNOSIS — I10 Essential (primary) hypertension: Secondary | ICD-10-CM | POA: Diagnosis not present

## 2017-05-13 DIAGNOSIS — Z125 Encounter for screening for malignant neoplasm of prostate: Secondary | ICD-10-CM | POA: Diagnosis not present

## 2017-05-13 DIAGNOSIS — R739 Hyperglycemia, unspecified: Secondary | ICD-10-CM | POA: Diagnosis not present

## 2017-05-13 DIAGNOSIS — E785 Hyperlipidemia, unspecified: Secondary | ICD-10-CM | POA: Diagnosis not present

## 2017-05-13 DIAGNOSIS — I10 Essential (primary) hypertension: Secondary | ICD-10-CM | POA: Diagnosis not present

## 2017-11-07 DIAGNOSIS — I10 Essential (primary) hypertension: Secondary | ICD-10-CM | POA: Diagnosis not present

## 2017-11-07 DIAGNOSIS — Z125 Encounter for screening for malignant neoplasm of prostate: Secondary | ICD-10-CM | POA: Diagnosis not present

## 2017-11-10 DIAGNOSIS — E785 Hyperlipidemia, unspecified: Secondary | ICD-10-CM | POA: Diagnosis not present

## 2017-11-10 DIAGNOSIS — I10 Essential (primary) hypertension: Secondary | ICD-10-CM | POA: Diagnosis not present

## 2018-05-04 DIAGNOSIS — I10 Essential (primary) hypertension: Secondary | ICD-10-CM | POA: Diagnosis not present

## 2018-05-12 DIAGNOSIS — I10 Essential (primary) hypertension: Secondary | ICD-10-CM | POA: Diagnosis not present

## 2018-05-12 DIAGNOSIS — E785 Hyperlipidemia, unspecified: Secondary | ICD-10-CM | POA: Diagnosis not present

## 2018-05-12 DIAGNOSIS — Z0000 Encounter for general adult medical examination without abnormal findings: Secondary | ICD-10-CM | POA: Diagnosis not present

## 2018-11-10 DIAGNOSIS — I10 Essential (primary) hypertension: Secondary | ICD-10-CM | POA: Diagnosis not present

## 2018-11-10 DIAGNOSIS — Z125 Encounter for screening for malignant neoplasm of prostate: Secondary | ICD-10-CM | POA: Diagnosis not present

## 2018-11-17 DIAGNOSIS — I10 Essential (primary) hypertension: Secondary | ICD-10-CM | POA: Diagnosis not present

## 2018-11-17 DIAGNOSIS — E785 Hyperlipidemia, unspecified: Secondary | ICD-10-CM | POA: Diagnosis not present

## 2019-05-09 DIAGNOSIS — Z125 Encounter for screening for malignant neoplasm of prostate: Secondary | ICD-10-CM | POA: Diagnosis not present

## 2019-05-09 DIAGNOSIS — Z0000 Encounter for general adult medical examination without abnormal findings: Secondary | ICD-10-CM | POA: Diagnosis not present

## 2019-05-09 DIAGNOSIS — I10 Essential (primary) hypertension: Secondary | ICD-10-CM | POA: Diagnosis not present

## 2019-05-09 DIAGNOSIS — E7800 Pure hypercholesterolemia, unspecified: Secondary | ICD-10-CM | POA: Diagnosis not present

## 2019-05-18 DIAGNOSIS — I10 Essential (primary) hypertension: Secondary | ICD-10-CM | POA: Diagnosis not present

## 2019-05-18 DIAGNOSIS — R739 Hyperglycemia, unspecified: Secondary | ICD-10-CM | POA: Diagnosis not present

## 2019-05-18 DIAGNOSIS — E785 Hyperlipidemia, unspecified: Secondary | ICD-10-CM | POA: Diagnosis not present

## 2019-05-18 DIAGNOSIS — E559 Vitamin D deficiency, unspecified: Secondary | ICD-10-CM | POA: Diagnosis not present

## 2019-05-24 DIAGNOSIS — R011 Cardiac murmur, unspecified: Secondary | ICD-10-CM | POA: Diagnosis not present

## 2019-11-12 DIAGNOSIS — Z125 Encounter for screening for malignant neoplasm of prostate: Secondary | ICD-10-CM | POA: Diagnosis not present

## 2019-11-12 DIAGNOSIS — I10 Essential (primary) hypertension: Secondary | ICD-10-CM | POA: Diagnosis not present

## 2019-11-12 DIAGNOSIS — E559 Vitamin D deficiency, unspecified: Secondary | ICD-10-CM | POA: Diagnosis not present

## 2019-11-12 DIAGNOSIS — R739 Hyperglycemia, unspecified: Secondary | ICD-10-CM | POA: Diagnosis not present

## 2019-11-19 DIAGNOSIS — R945 Abnormal results of liver function studies: Secondary | ICD-10-CM | POA: Diagnosis not present

## 2019-11-19 DIAGNOSIS — Z0000 Encounter for general adult medical examination without abnormal findings: Secondary | ICD-10-CM | POA: Diagnosis not present

## 2019-12-06 DIAGNOSIS — R945 Abnormal results of liver function studies: Secondary | ICD-10-CM | POA: Diagnosis not present

## 2019-12-06 DIAGNOSIS — K760 Fatty (change of) liver, not elsewhere classified: Secondary | ICD-10-CM | POA: Diagnosis not present

## 2019-12-10 DIAGNOSIS — R945 Abnormal results of liver function studies: Secondary | ICD-10-CM | POA: Diagnosis not present

## 2019-12-10 DIAGNOSIS — I10 Essential (primary) hypertension: Secondary | ICD-10-CM | POA: Diagnosis not present

## 2019-12-10 DIAGNOSIS — E7800 Pure hypercholesterolemia, unspecified: Secondary | ICD-10-CM | POA: Diagnosis not present

## 2019-12-24 DIAGNOSIS — I10 Essential (primary) hypertension: Secondary | ICD-10-CM | POA: Diagnosis not present

## 2020-01-21 DIAGNOSIS — I10 Essential (primary) hypertension: Secondary | ICD-10-CM | POA: Diagnosis not present

## 2020-03-03 DIAGNOSIS — R7303 Prediabetes: Secondary | ICD-10-CM | POA: Diagnosis not present

## 2020-03-03 DIAGNOSIS — I10 Essential (primary) hypertension: Secondary | ICD-10-CM | POA: Diagnosis not present

## 2022-04-17 ENCOUNTER — Emergency Department (HOSPITAL_COMMUNITY): Payer: BC Managed Care – PPO

## 2022-04-17 ENCOUNTER — Encounter (HOSPITAL_COMMUNITY): Payer: Self-pay | Admitting: Emergency Medicine

## 2022-04-17 ENCOUNTER — Inpatient Hospital Stay (HOSPITAL_COMMUNITY)
Admission: EM | Admit: 2022-04-17 | Discharge: 2022-04-27 | DRG: 065 | Disposition: A | Discharge status: Skilled Nursing Facility | Payer: BC Managed Care – PPO | Attending: Internal Medicine | Admitting: Internal Medicine

## 2022-04-17 ENCOUNTER — Inpatient Hospital Stay (HOSPITAL_COMMUNITY): Payer: BC Managed Care – PPO

## 2022-04-17 DIAGNOSIS — R7303 Prediabetes: Secondary | ICD-10-CM | POA: Diagnosis present

## 2022-04-17 DIAGNOSIS — N179 Acute kidney failure, unspecified: Secondary | ICD-10-CM | POA: Diagnosis not present

## 2022-04-17 DIAGNOSIS — I6389 Other cerebral infarction: Secondary | ICD-10-CM | POA: Diagnosis not present

## 2022-04-17 DIAGNOSIS — M6282 Rhabdomyolysis: Secondary | ICD-10-CM

## 2022-04-17 DIAGNOSIS — E870 Hyperosmolality and hypernatremia: Secondary | ICD-10-CM | POA: Diagnosis not present

## 2022-04-17 DIAGNOSIS — R1310 Dysphagia, unspecified: Secondary | ICD-10-CM

## 2022-04-17 DIAGNOSIS — I6381 Other cerebral infarction due to occlusion or stenosis of small artery: Secondary | ICD-10-CM | POA: Diagnosis not present

## 2022-04-17 DIAGNOSIS — G8191 Hemiplegia, unspecified affecting right dominant side: Secondary | ICD-10-CM | POA: Diagnosis present

## 2022-04-17 DIAGNOSIS — I69891 Dysphagia following other cerebrovascular disease: Secondary | ICD-10-CM | POA: Diagnosis not present

## 2022-04-17 DIAGNOSIS — Z880 Allergy status to penicillin: Secondary | ICD-10-CM | POA: Diagnosis not present

## 2022-04-17 DIAGNOSIS — R0902 Hypoxemia: Secondary | ICD-10-CM | POA: Diagnosis not present

## 2022-04-17 DIAGNOSIS — Z888 Allergy status to other drugs, medicaments and biological substances status: Secondary | ICD-10-CM

## 2022-04-17 DIAGNOSIS — T502X6A Underdosing of carbonic-anhydrase inhibitors, benzothiadiazides and other diuretics, initial encounter: Secondary | ICD-10-CM | POA: Diagnosis present

## 2022-04-17 DIAGNOSIS — R000 Tachycardia, unspecified: Secondary | ICD-10-CM

## 2022-04-17 DIAGNOSIS — R7989 Other specified abnormal findings of blood chemistry: Secondary | ICD-10-CM | POA: Diagnosis not present

## 2022-04-17 DIAGNOSIS — D72829 Elevated white blood cell count, unspecified: Secondary | ICD-10-CM | POA: Diagnosis not present

## 2022-04-17 DIAGNOSIS — I10 Essential (primary) hypertension: Secondary | ICD-10-CM | POA: Diagnosis present

## 2022-04-17 DIAGNOSIS — R079 Chest pain, unspecified: Secondary | ICD-10-CM | POA: Diagnosis not present

## 2022-04-17 DIAGNOSIS — R4701 Aphasia: Secondary | ICD-10-CM | POA: Diagnosis not present

## 2022-04-17 DIAGNOSIS — Z7401 Bed confinement status: Secondary | ICD-10-CM | POA: Diagnosis not present

## 2022-04-17 DIAGNOSIS — R1312 Dysphagia, oropharyngeal phase: Secondary | ICD-10-CM | POA: Diagnosis not present

## 2022-04-17 DIAGNOSIS — H510 Palsy (spasm) of conjugate gaze: Secondary | ICD-10-CM | POA: Diagnosis not present

## 2022-04-17 DIAGNOSIS — Z20822 Contact with and (suspected) exposure to covid-19: Secondary | ICD-10-CM | POA: Diagnosis not present

## 2022-04-17 DIAGNOSIS — I69351 Hemiplegia and hemiparesis following cerebral infarction affecting right dominant side: Secondary | ICD-10-CM | POA: Diagnosis not present

## 2022-04-17 DIAGNOSIS — E861 Hypovolemia: Secondary | ICD-10-CM | POA: Diagnosis not present

## 2022-04-17 DIAGNOSIS — R1311 Dysphagia, oral phase: Secondary | ICD-10-CM | POA: Diagnosis not present

## 2022-04-17 DIAGNOSIS — R2681 Unsteadiness on feet: Secondary | ICD-10-CM | POA: Diagnosis not present

## 2022-04-17 DIAGNOSIS — M2550 Pain in unspecified joint: Secondary | ICD-10-CM | POA: Diagnosis not present

## 2022-04-17 DIAGNOSIS — R414 Neurologic neglect syndrome: Secondary | ICD-10-CM

## 2022-04-17 DIAGNOSIS — E782 Mixed hyperlipidemia: Secondary | ICD-10-CM | POA: Diagnosis not present

## 2022-04-17 DIAGNOSIS — D751 Secondary polycythemia: Secondary | ICD-10-CM | POA: Insufficient documentation

## 2022-04-17 DIAGNOSIS — I639 Cerebral infarction, unspecified: Secondary | ICD-10-CM | POA: Diagnosis not present

## 2022-04-17 DIAGNOSIS — R5383 Other fatigue: Secondary | ICD-10-CM | POA: Diagnosis not present

## 2022-04-17 DIAGNOSIS — I517 Cardiomegaly: Secondary | ICD-10-CM | POA: Diagnosis not present

## 2022-04-17 DIAGNOSIS — E872 Acidosis, unspecified: Secondary | ICD-10-CM | POA: Diagnosis not present

## 2022-04-17 DIAGNOSIS — R6510 Systemic inflammatory response syndrome (SIRS) of non-infectious origin without acute organ dysfunction: Secondary | ICD-10-CM | POA: Diagnosis present

## 2022-04-17 DIAGNOSIS — E785 Hyperlipidemia, unspecified: Secondary | ICD-10-CM

## 2022-04-17 DIAGNOSIS — R9431 Abnormal electrocardiogram [ECG] [EKG]: Secondary | ICD-10-CM | POA: Diagnosis present

## 2022-04-17 DIAGNOSIS — T461X6A Underdosing of calcium-channel blockers, initial encounter: Secondary | ICD-10-CM | POA: Diagnosis present

## 2022-04-17 DIAGNOSIS — I69828 Other speech and language deficits following other cerebrovascular disease: Secondary | ICD-10-CM | POA: Diagnosis not present

## 2022-04-17 DIAGNOSIS — Z833 Family history of diabetes mellitus: Secondary | ICD-10-CM | POA: Diagnosis not present

## 2022-04-17 DIAGNOSIS — I959 Hypotension, unspecified: Secondary | ICD-10-CM | POA: Diagnosis not present

## 2022-04-17 DIAGNOSIS — E041 Nontoxic single thyroid nodule: Secondary | ICD-10-CM | POA: Diagnosis not present

## 2022-04-17 DIAGNOSIS — I63233 Cerebral infarction due to unspecified occlusion or stenosis of bilateral carotid arteries: Secondary | ICD-10-CM | POA: Diagnosis not present

## 2022-04-17 DIAGNOSIS — I160 Hypertensive urgency: Secondary | ICD-10-CM | POA: Diagnosis not present

## 2022-04-17 DIAGNOSIS — E860 Dehydration: Secondary | ICD-10-CM | POA: Diagnosis present

## 2022-04-17 DIAGNOSIS — Z4682 Encounter for fitting and adjustment of non-vascular catheter: Secondary | ICD-10-CM | POA: Diagnosis not present

## 2022-04-17 DIAGNOSIS — R7401 Elevation of levels of liver transaminase levels: Secondary | ICD-10-CM | POA: Diagnosis not present

## 2022-04-17 DIAGNOSIS — I161 Hypertensive emergency: Secondary | ICD-10-CM | POA: Diagnosis present

## 2022-04-17 DIAGNOSIS — I69320 Aphasia following cerebral infarction: Secondary | ICD-10-CM | POA: Diagnosis not present

## 2022-04-17 DIAGNOSIS — M6281 Muscle weakness (generalized): Secondary | ICD-10-CM | POA: Diagnosis not present

## 2022-04-17 LAB — CBG MONITORING, ED
Glucose-Capillary: 167 mg/dL — ABNORMAL HIGH (ref 70–99)
Glucose-Capillary: 158 mg/dL — ABNORMAL HIGH (ref 70–99)

## 2022-04-17 LAB — I-STAT CHEM 8, ED
BUN: 61 mg/dL — ABNORMAL HIGH (ref 6–20)
Calcium, Ion: 1.01 mmol/L — ABNORMAL LOW (ref 1.15–1.40)
Chloride: 115 mmol/L — ABNORMAL HIGH (ref 98–111)
Creatinine, Ser: 3.5 mg/dL — ABNORMAL HIGH (ref 0.61–1.24)
Glucose, Bld: 171 mg/dL — ABNORMAL HIGH (ref 70–99)
HCT: 61 % — ABNORMAL HIGH (ref 39.0–52.0)
Hemoglobin: 20.7 g/dL — ABNORMAL HIGH (ref 13.0–17.0)
Potassium: 4.2 mmol/L (ref 3.5–5.1)
Sodium: 145 mmol/L (ref 135–145)
TCO2: 18 mmol/L — ABNORMAL LOW (ref 22–32)

## 2022-04-17 LAB — SARS CORONAVIRUS 2 BY RT PCR: SARS Coronavirus 2 by RT PCR: NEGATIVE

## 2022-04-17 LAB — COMPREHENSIVE METABOLIC PANEL
ALT: 55 U/L — ABNORMAL HIGH (ref 0–44)
AST: 83 U/L — ABNORMAL HIGH (ref 15–41)
Albumin: 4 g/dL (ref 3.5–5.0)
Alkaline Phosphatase: 83 U/L (ref 38–126)
Anion gap: 17 — ABNORMAL HIGH (ref 5–15)
BUN: 61 mg/dL — ABNORMAL HIGH (ref 6–20)
CO2: 17 mmol/L — ABNORMAL LOW (ref 22–32)
Calcium: 9.9 mg/dL (ref 8.9–10.3)
Chloride: 110 mmol/L (ref 98–111)
Creatinine, Ser: 3.39 mg/dL — ABNORMAL HIGH (ref 0.61–1.24)
GFR, Estimated: 20 mL/min — ABNORMAL LOW (ref >60)
Glucose, Bld: 172 mg/dL — ABNORMAL HIGH (ref 70–99)
Potassium: 4.3 mmol/L (ref 3.5–5.1)
Sodium: 144 mmol/L (ref 135–145)
Total Bilirubin: 0.8 mg/dL (ref 0.3–1.2)
Total Protein: 8.5 g/dL — ABNORMAL HIGH (ref 6.5–8.1)

## 2022-04-17 LAB — GLUCOSE, CAPILLARY: Glucose-Capillary: 161 mg/dL — ABNORMAL HIGH (ref 70–99)

## 2022-04-17 LAB — DIFFERENTIAL
Abs Immature Granulocytes: 0.19 10*3/uL — ABNORMAL HIGH (ref 0.00–0.07)
Basophils Absolute: 0.1 10*3/uL (ref 0.0–0.1)
Basophils Relative: 0 %
Eosinophils Absolute: 0 10*3/uL (ref 0.0–0.5)
Eosinophils Relative: 0 %
Immature Granulocytes: 1 %
Lymphocytes Relative: 6 %
Lymphs Abs: 1.7 10*3/uL (ref 0.7–4.0)
Monocytes Absolute: 1.7 10*3/uL — ABNORMAL HIGH (ref 0.1–1.0)
Monocytes Relative: 6 %
Neutro Abs: 23.5 10*3/uL — ABNORMAL HIGH (ref 1.7–7.7)
Neutrophils Relative %: 87 %

## 2022-04-17 LAB — APTT
aPTT: UNDETERMINED seconds (ref 24–36)
aPTT: 31 seconds (ref 24–36)

## 2022-04-17 LAB — CK: Total CK: 3583 U/L — ABNORMAL HIGH (ref 49–397)

## 2022-04-17 LAB — MAGNESIUM: Magnesium: 3.1 mg/dL — ABNORMAL HIGH (ref 1.7–2.4)

## 2022-04-17 LAB — PROTIME-INR
INR: UNDETERMINED (ref 0.8–1.2)
INR: 1.2 (ref 0.8–1.2)
Prothrombin Time: UNDETERMINED seconds (ref 11.4–15.2)
Prothrombin Time: 14.8 seconds (ref 11.4–15.2)

## 2022-04-17 LAB — CBC
HCT: 61.1 % — ABNORMAL HIGH (ref 39.0–52.0)
Hemoglobin: 20.4 g/dL — ABNORMAL HIGH (ref 13.0–17.0)
MCH: 29.4 pg (ref 26.0–34.0)
MCHC: 33.4 g/dL (ref 30.0–36.0)
MCV: 88.2 fL (ref 80.0–100.0)
Platelets: 493 10*3/uL — ABNORMAL HIGH (ref 150–400)
RBC: 6.93 MIL/uL — ABNORMAL HIGH (ref 4.22–5.81)
RDW: 15.9 % — ABNORMAL HIGH (ref 11.5–15.5)
WBC: 27.1 10*3/uL — ABNORMAL HIGH (ref 4.0–10.5)
nRBC: 0 % (ref 0.0–0.2)

## 2022-04-17 LAB — ETHANOL: Alcohol, Ethyl (B): <10 mg/dL (ref <10)

## 2022-04-17 LAB — HIV ANTIBODY (ROUTINE TESTING W REFLEX): HIV Screen 4th Generation wRfx: NONREACTIVE

## 2022-04-17 MED ORDER — SODIUM CHLORIDE 0.9 % IV BOLUS
1000.0000 mL | Freq: Once | INTRAVENOUS | Status: AC
  Administered 2022-04-17: 1000 mL via INTRAVENOUS

## 2022-04-17 MED ORDER — LORAZEPAM 2 MG/ML IJ SOLN
2.0000 mg | Freq: Once | INTRAMUSCULAR | Status: AC
  Administered 2022-04-17: 2 mg via INTRAVENOUS
  Filled 2022-04-17: qty 1

## 2022-04-17 MED ORDER — ASPIRIN 81 MG PO CHEW: 81.0000 mg | CHEWABLE_TABLET | Freq: Every day | ORAL | Status: DC

## 2022-04-17 MED ORDER — CLOPIDOGREL BISULFATE 75 MG PO TABS: 75.0000 mg | ORAL_TABLET | Freq: Every day | ORAL | Status: DC

## 2022-04-17 MED ORDER — ACETAMINOPHEN 160 MG/5ML PO SOLN
650.0000 mg | ORAL | Status: DC | PRN
  Administered 2022-04-22: 650 mg
  Filled 2022-04-17 (×2): qty 20.3

## 2022-04-17 MED ORDER — ASPIRIN 300 MG RE SUPP
300.0000 mg | Freq: Once | RECTAL | Status: DC
Start: 2022-04-17 — End: 2022-04-18

## 2022-04-17 MED ORDER — STROKE: EARLY STAGES OF RECOVERY BOOK: Freq: Once | Status: AC

## 2022-04-17 MED ORDER — HEPARIN SODIUM (PORCINE) 5000 UNIT/ML IJ SOLN
5000.0000 [IU] | Freq: Three times a day (TID) | INTRAMUSCULAR | Status: DC
Start: 2022-04-17 — End: 2022-04-27
  Administered 2022-04-18 – 2022-04-27 (×30): 5000 [IU] via SUBCUTANEOUS
  Filled 2022-04-17 (×29): qty 1

## 2022-04-17 MED ORDER — INSULIN ASPART 100 UNIT/ML IJ SOLN
0.0000 [IU] | INTRAMUSCULAR | Status: DC
  Administered 2022-04-17 – 2022-04-20 (×9): 1 [IU] via SUBCUTANEOUS
  Administered 2022-04-21: 2 [IU] via SUBCUTANEOUS
  Administered 2022-04-21 (×3): 1 [IU] via SUBCUTANEOUS
  Administered 2022-04-22: 2 [IU] via SUBCUTANEOUS
  Administered 2022-04-22 – 2022-04-24 (×10): 1 [IU] via SUBCUTANEOUS
  Administered 2022-04-24: 2 [IU] via SUBCUTANEOUS
  Administered 2022-04-24 – 2022-04-25 (×3): 1 [IU] via SUBCUTANEOUS
  Administered 2022-04-25: 2 [IU] via SUBCUTANEOUS
  Administered 2022-04-25: 1 [IU] via SUBCUTANEOUS

## 2022-04-17 MED ORDER — ACETAMINOPHEN 650 MG RE SUPP
650.0000 mg | RECTAL | Status: DC | PRN
  Administered 2022-04-18: 650 mg via RECTAL
  Filled 2022-04-17: qty 1

## 2022-04-17 MED ORDER — ASPIRIN 300 MG RE SUPP
300.0000 mg | Freq: Every day | RECTAL | Status: DC
  Administered 2022-04-18 – 2022-04-19 (×2): 300 mg via RECTAL
  Filled 2022-04-17 (×2): qty 1

## 2022-04-17 MED ORDER — IOHEXOL 350 MG/ML SOLN
100.0000 mL | Freq: Once | INTRAVENOUS | Status: AC | PRN
  Administered 2022-04-17: 100 mL via INTRAVENOUS

## 2022-04-17 MED ORDER — SODIUM CHLORIDE 0.9% FLUSH
3.0000 mL | Freq: Once | INTRAVENOUS | Status: AC
  Administered 2022-04-17: 3 mL via INTRAVENOUS

## 2022-04-17 MED ORDER — ACETAMINOPHEN 325 MG PO TABS
650.0000 mg | ORAL_TABLET | ORAL | Status: DC | PRN
  Administered 2022-04-26: 650 mg via ORAL
  Filled 2022-04-17: qty 2

## 2022-04-17 MED ORDER — SODIUM CHLORIDE 0.9 % IV SOLN
INTRAVENOUS | Status: DC
  Administered 2022-04-17: 125 mL/h via INTRAVENOUS

## 2022-04-17 NOTE — Code Documentation (Signed)
Stroke Response Nurse Documentation Code Documentation  Alex Grant is a 56 y.o. male arriving to [facility]  via [facility] on 04-17-2022 with past medical hx of HTN,. On No antithrombotic. Code stroke was activated by EMS.   Patient from [facility] where he was LKW at 6pm 04/16/2022 and now complaining of Right side paralysis and mute with left gaze.  Per EMS patient was as work yesterday, it is unclear how late he worked but in was confirmed that he was his normal self at 6pm.  When he didn't show up for work this morning at [employer], they tried to contact him.  Eventually EMS was called for a well check.  Upon their arrival they found him in bed unable to move his right side, mute and gazing to the left.   Stroke team at the bedside on patient arrival. Labs drawn and patient cleared for CT by Dr. Denton Lank. Patient to CT with team. NIHSS 27, see documentation for details and code stroke times. Patient with disoriented, not following commands, left gaze preference , right hemianopia, right facial droop, right arm weakness, right leg weakness, right decreased sensation, Global aphasia , dysarthria , and right neglect on exam. The following imaging was completed:  CT Head, CTA, and CTP. Patient is not a candidate for IV Thrombolytic due to being outside the window. Patient is not a candidate for IR due to no LVO on CTA.   Care Plan: VS and m NIHSS q 2 hours.  Hospitalist admission, stroke work up  Bedside handoff with ED RN Chestine Spore.    Marcellina Millin  Stroke Response RN

## 2022-04-17 NOTE — ED Notes (Signed)
Lab will add on Magnesium, CK, hepatitis panel

## 2022-04-18 ENCOUNTER — Inpatient Hospital Stay (HOSPITAL_COMMUNITY): Payer: BC Managed Care – PPO

## 2022-04-18 DIAGNOSIS — M6282 Rhabdomyolysis: Secondary | ICD-10-CM

## 2022-04-18 DIAGNOSIS — I6389 Other cerebral infarction: Secondary | ICD-10-CM

## 2022-04-18 DIAGNOSIS — R1312 Dysphagia, oropharyngeal phase: Secondary | ICD-10-CM

## 2022-04-18 DIAGNOSIS — R1310 Dysphagia, unspecified: Secondary | ICD-10-CM

## 2022-04-18 DIAGNOSIS — R7989 Other specified abnormal findings of blood chemistry: Secondary | ICD-10-CM

## 2022-04-18 DIAGNOSIS — E782 Mixed hyperlipidemia: Secondary | ICD-10-CM

## 2022-04-18 DIAGNOSIS — E785 Hyperlipidemia, unspecified: Secondary | ICD-10-CM

## 2022-04-18 LAB — HEMOGLOBIN A1C
Hgb A1c MFr Bld: 5.9 % — ABNORMAL HIGH (ref 4.8–5.6)
Mean Plasma Glucose: 122.63 mg/dL

## 2022-04-18 LAB — CBC
HCT: 54.9 % — ABNORMAL HIGH (ref 39.0–52.0)
Hemoglobin: 17.7 g/dL — ABNORMAL HIGH (ref 13.0–17.0)
MCH: 29.1 pg (ref 26.0–34.0)
MCHC: 32.2 g/dL (ref 30.0–36.0)
MCV: 90.1 fL (ref 80.0–100.0)
Platelets: 387 10*3/uL (ref 150–400)
RBC: 6.09 MIL/uL — ABNORMAL HIGH (ref 4.22–5.81)
RDW: 14.6 % (ref 11.5–15.5)
WBC: 27.6 10*3/uL — ABNORMAL HIGH (ref 4.0–10.5)
nRBC: 0 % (ref 0.0–0.2)

## 2022-04-18 LAB — COMPREHENSIVE METABOLIC PANEL
ALT: 47 U/L — ABNORMAL HIGH (ref 0–44)
AST: 75 U/L — ABNORMAL HIGH (ref 15–41)
Albumin: 3.3 g/dL — ABNORMAL LOW (ref 3.5–5.0)
Alkaline Phosphatase: 66 U/L (ref 38–126)
Anion gap: 12 (ref 5–15)
BUN: 68 mg/dL — ABNORMAL HIGH (ref 6–20)
CO2: 19 mmol/L — ABNORMAL LOW (ref 22–32)
Calcium: 8.8 mg/dL — ABNORMAL LOW (ref 8.9–10.3)
Chloride: 115 mmol/L — ABNORMAL HIGH (ref 98–111)
Creatinine, Ser: 2.95 mg/dL — ABNORMAL HIGH (ref 0.61–1.24)
GFR, Estimated: 24 mL/min — ABNORMAL LOW (ref >60)
Glucose, Bld: 141 mg/dL — ABNORMAL HIGH (ref 70–99)
Potassium: 4 mmol/L (ref 3.5–5.1)
Sodium: 146 mmol/L — ABNORMAL HIGH (ref 135–145)
Total Bilirubin: 0.9 mg/dL (ref 0.3–1.2)
Total Protein: 7 g/dL (ref 6.5–8.1)

## 2022-04-18 LAB — GLUCOSE, CAPILLARY
Glucose-Capillary: 160 mg/dL — ABNORMAL HIGH (ref 70–99)
Glucose-Capillary: 149 mg/dL — ABNORMAL HIGH (ref 70–99)
Glucose-Capillary: 159 mg/dL — ABNORMAL HIGH (ref 70–99)
Glucose-Capillary: 146 mg/dL — ABNORMAL HIGH (ref 70–99)
Glucose-Capillary: 156 mg/dL — ABNORMAL HIGH (ref 70–99)
Glucose-Capillary: 126 mg/dL — ABNORMAL HIGH (ref 70–99)
Glucose-Capillary: 126 mg/dL — ABNORMAL HIGH (ref 70–99)

## 2022-04-18 LAB — URINALYSIS, ROUTINE W REFLEX MICROSCOPIC
Bilirubin Urine: NEGATIVE
Glucose, UA: NEGATIVE mg/dL
Ketones, ur: NEGATIVE mg/dL
Leukocytes,Ua: NEGATIVE
Nitrite: NEGATIVE
Protein, ur: >=300 mg/dL — AB
Specific Gravity, Urine: >1.046 — ABNORMAL HIGH (ref 1.005–1.030)
pH: 5 (ref 5.0–8.0)

## 2022-04-18 LAB — LIPID PANEL
Cholesterol: 317 mg/dL — ABNORMAL HIGH (ref 0–200)
HDL: 31 mg/dL — ABNORMAL LOW (ref >40)
LDL Cholesterol: 217 mg/dL — ABNORMAL HIGH (ref 0–99)
Total CHOL/HDL Ratio: 10.2 RATIO
Triglycerides: 345 mg/dL — ABNORMAL HIGH (ref <150)
VLDL: 69 mg/dL — ABNORMAL HIGH (ref 0–40)

## 2022-04-18 LAB — ECHOCARDIOGRAM COMPLETE
AR max vel: 3.24 cm2
AV Area VTI: 2.91 cm2
AV Area mean vel: 3.33 cm2
AV Mean grad: 6 mmHg
AV Peak grad: 11.3 mmHg
Ao pk vel: 1.68 m/s
Area-P 1/2: 3.85 cm2
Height: 68 in
P 1/2 time: 311 msec
S' Lateral: 3.3 cm
Weight: 2998.26 oz

## 2022-04-18 LAB — HEPATITIS PANEL, ACUTE
HCV Ab: NONREACTIVE
Hep A IgM: NONREACTIVE
Hep B C IgM: NONREACTIVE
Hepatitis B Surface Ag: NONREACTIVE

## 2022-04-18 LAB — CK: Total CK: 3327 U/L — ABNORMAL HIGH (ref 49–397)

## 2022-04-18 LAB — CREATININE, URINE, RANDOM: Creatinine, Urine: 447.28 mg/dL

## 2022-04-18 LAB — SODIUM, URINE, RANDOM: Sodium, Ur: <10 mmol/L

## 2022-04-18 LAB — PROCALCITONIN: Procalcitonin: 0.14 ng/mL

## 2022-04-18 LAB — LACTIC ACID, PLASMA: Lactic Acid, Venous: 1.3 mmol/L (ref 0.5–1.9)

## 2022-04-18 MED ORDER — SODIUM CHLORIDE 0.9 % IV SOLN: INTRAVENOUS | Status: DC

## 2022-04-18 MED ORDER — ORAL CARE MOUTH RINSE: 15.0000 mL | OROMUCOSAL | Status: DC | PRN

## 2022-04-18 MED ORDER — SODIUM CHLORIDE 0.9 % IV SOLN
2.0000 g | Freq: Once | INTRAVENOUS | Status: DC
  Filled 2022-04-18: qty 10

## 2022-04-18 MED ORDER — SODIUM CHLORIDE 0.9 % IV SOLN
2.0000 g | INTRAVENOUS | Status: DC
  Administered 2022-04-19: 2 g via INTRAVENOUS
  Filled 2022-04-18: qty 12.5

## 2022-04-18 MED ORDER — VANCOMYCIN VARIABLE DOSE PER UNSTABLE RENAL FUNCTION (PHARMACIST DOSING): Status: DC

## 2022-04-18 MED ORDER — VANCOMYCIN HCL 1500 MG/300ML IV SOLN
1500.0000 mg | Freq: Once | INTRAVENOUS | Status: AC
  Administered 2022-04-18: 1500 mg via INTRAVENOUS
  Filled 2022-04-18: qty 300

## 2022-04-18 MED ORDER — ORAL CARE MOUTH RINSE
15.0000 mL | OROMUCOSAL | Status: DC
  Administered 2022-04-18 – 2022-04-27 (×33): 15 mL via OROMUCOSAL

## 2022-04-18 MED ORDER — SODIUM CHLORIDE 0.9 % IV SOLN
2.0000 g | Freq: Once | INTRAVENOUS | Status: AC
  Administered 2022-04-18: 2 g via INTRAVENOUS
  Filled 2022-04-18: qty 12.5

## 2022-04-18 MED ORDER — METRONIDAZOLE 500 MG/100ML IV SOLN
500.0000 mg | Freq: Two times a day (BID) | INTRAVENOUS | Status: DC
  Administered 2022-04-18 – 2022-04-20 (×5): 500 mg via INTRAVENOUS
  Filled 2022-04-18 (×5): qty 100

## 2022-04-18 NOTE — Assessment & Plan Note (Addendum)
-   febrile, tachycardia, tachypnea, leukocytosis.  No apparent source at this time.  Etiology possibly from inflammatory response due to rhabdomyolysis, AKI, and stroke versus true infection.  Higher suspicion at this time would be for possible aspiration given his severe stroke although CXR clear on admission.  - repeat CXR on 6/26 still negative for infiltrates or evidence of infection - follow up cultures  - For now, continue on vancomycin, cefepime, Flagyl and will de-escalate as able - trend procalcitonin (0.14>>less than 0.1) - if no obvious infection declared by 6/27, would probably stop all abx and monitor off (seems this may be all reactive/inflammatory on admission)

## 2022-04-18 NOTE — Assessment & Plan Note (Signed)
-   Incidentally noted on CT on admission - 2.2 cm right thyroid nodule - If recovers, will need eventual thyroid ultrasound and further work-up

## 2022-04-18 NOTE — Assessment & Plan Note (Signed)
-   CHL 317, TG 345, HDL 31, LDL 217 - Will eventually need statin but on hold for now in setting of elevated LFTs

## 2022-04-18 NOTE — Progress Notes (Signed)
Progress Note    Alex Grant   WUJ:[id number]  DOB: 1966/07/15  DOA: 04/17/2022     1 PCP: Patient, No Pcp Per  Initial CC: found down  Hospital Course: Alex Grant is a 56 yo male with PMH HTN, HLD, prediabetes who presented via EMS after being found down at home.  Patient was unable to provide any information due to unresponsiveness on admission and all collateral information was obtained from family.  It appears he was last communicated with on Thursday via phone conversation with his dad.  When EMS found him, he was still in bed when found during welfare check on Saturday.  CT head showed acute infarct involving left basal ganglia, no hemorrhage.  MRI brain also performed after admission which also confirmed acute infarct involving left basal ganglia and corona radiata. Patient had dense right-sided weakness and was unable to speak during evaluation.  Interval History:  Family present this morning and given extensive update and explanation of plan. They do want NGT placed to start nutrition. They are hopeful he will recover but I also explained his stroke is rather significant.  NGT attempt was unsuccessful this afternoon, therefore will have cortrak attempt tomorrow.  He was awake and listening. Seems to understand commands and can follow them when not needing to use right side.   Assessment and Plan: * Acute ischemic stroke (HCC) - Found down in bed at home during welfare check.  Last known normal on Thursday when talking on the phone with dad.  CT head and MRI brain revealed left basal ganglia and corona radiata stroke - Patient has dense right-sided hemiparesis and appears to have right-sided neglect and mutism - continue asa for now - once NGT placed, will start plavix as well per neuro rec's - statin on hold until LFTs improve further  - echo reviewed; will plan on 30 day monitor as well at discharge per neuro rec's  Dysphagia - due to CVA - failed SLP eval and given severity  of stroke not likely to be safe for PO intake anytime soon - last meal appears to be Thursday; given already 3 days without nutrition, will have NGT placed today and start TF as well as other meds via NGT once placed too  SIRS (systemic inflammatory response syndrome) (HCC) - febrile, tachycardia, tachypnea, leukocytosis.  No apparent source at this time.  Etiology possibly from inflammatory response due to rhabdomyolysis, AKI, and stroke versus true infection.  Higher suspicion at this time would be for possible aspiration given his severe stroke although CXR clear on admission.  Will plan on repeating CXR tomorrow for any further evolution - follow up cultures  - For now, continue on vancomycin, cefepime, Flagyl and will de-escalate as able - Check procalcitonin (0.14) and trend  Rhabdomyolysis - presumed from prolonged downtime; family reports EMS found him in bed - CK 3,583 on admission - continue trending CK - continue IVF  AKI (acute kidney injury) (HCC) - baseline creatinine ~ 1 - patient presents with increase in creat >0.3 mg/dL above baseline, creat increase >1.5x baseline presumed to have occurred within past 7 days PTA - differential includes pre-renal from no intake ~2-3 days vs pigment nephropathy from rhabdo vs ATN from prolonged pre-renal (still at risk) - creat 3.39 on admission; appears to have peaked at 3.5 - renal u/s unremarkable  - continue IVF and trend BMP    HLD (hyperlipidemia) - CHL 317, TG 345, HDL 31, LDL 217 - Will eventually need statin but  on hold for now in setting of elevated LFTs  Right thyroid nodule - Incidentally noted on CT on admission - 2.2 cm right thyroid nodule - If recovers, will need eventual thyroid ultrasound and further work-up  Prediabetes - A1c 5.9% - Diet control once able to initiate tube feeds  Prolonged QT interval - QTtc 519 on EKG - monitor K and Mg   Hypertension - Permissive hypertension for now in setting of large  stroke.  Will bring down slowly over next few days  Elevated LFTs - elevated AST/ALT. No signs of obstructive pattern - suspected from acute illness/rhabdo -Hepatitis panel negative - hold off on statin for CVA until improvement - continue trending   Metabolic acidosis - See AKI and rhabdomyolysis - Continue fluids - Trend BMP   Old records reviewed in assessment of this patient  Antimicrobials: Vanc 6/24 >> current Cefepime 6/24 >> current  Flagyl 6/24 >> current   DVT prophylaxis:  heparin injection 5,000 Units Start: 04/17/22 2200   Code Status:   Code Status: Full Code  Mobility Assessment (last 72 hours)     Mobility Assessment     Row Name 04/18/22 0800 04/17/22 2200         Does patient have an order for bedrest or is patient medically unstable Yes- Bedfast (Level 1) - Complete Yes- Bedfast (Level 1) - Complete      What is the highest level of mobility based on the progressive mobility assessment? Level 1 (Bedfast) - Unable to balance while sitting on edge of bed Level 1 (Bedfast) - Unable to balance while sitting on edge of bed      Is the above level different from baseline mobility prior to current illness? Yes - Recommend PT order Yes - Recommend PT order               Disposition Plan:  Likely SNF but depends on clinical course Status is: Inpt  Objective: Blood pressure (!) 189/83, pulse 97, temperature 98.7 F (37.1 C), temperature source Oral, resp. rate (!) 21, height 5\' 8"  (1.727 m), weight 85 kg, SpO2 94 %.  Examination:  Physical Exam Constitutional:      Comments: Awake and looking at me. Cannot speak  HENT:     Head: Normocephalic and atraumatic.     Mouth/Throat:     Mouth: Mucous membranes are moist.  Eyes:     Extraocular Movements: Extraocular movements intact.     Pupils: Pupils are equal, round, and reactive to light.     Comments: Slow eye movements  Cardiovascular:     Rate and Rhythm: Normal rate and regular rhythm.   Pulmonary:     Effort: Pulmonary effort is normal.     Breath sounds: Normal breath sounds.  Abdominal:     General: Bowel sounds are normal. There is no distension.     Palpations: Abdomen is soft.     Tenderness: There is no abdominal tenderness.  Musculoskeletal:        General: No swelling or tenderness.     Cervical back: Normal range of motion and neck supple.  Skin:    General: Skin is warm and dry.     Coloration: Skin is not jaundiced or pale.  Neurological:     Comments: 0/5 strength in RUE/RLE. Stiffness in RUE. Seems to have R side neglect. Mutism. Follows commands on the left. Unable to assess if he has paresthesias due to poor mentation.       Consultants:  Neurology  Procedures:    Data Reviewed: Results for orders placed or performed during the hospital encounter of 04/17/22 (from the past 24 hour(s))  CBG monitoring, ED     Status: Abnormal   Collection Time: 04/17/22  5:12 PM  Result Value Ref Range   Glucose-Capillary 167 (H) 70 - 99 mg/dL  Protime-INR     Status: None   Collection Time: 04/17/22  5:16 PM  Result Value Ref Range   Prothrombin Time  11.4 - 15.2 seconds    SPECIMEN/CONTAINER TYPE INAPPROPRIATE FOR ORDERED TEST, UNABLE TO PERFORM   INR  0.8 - 1.2    SPECIMEN/CONTAINER TYPE INAPPROPRIATE FOR ORDERED TEST, UNABLE TO PERFORM  APTT     Status: None   Collection Time: 04/17/22  5:16 PM  Result Value Ref Range   aPTT  24 - 36 seconds    SPECIMEN/CONTAINER TYPE INAPPROPRIATE FOR ORDERED TEST, UNABLE TO PERFORM  CBC     Status: Abnormal   Collection Time: 04/17/22  5:16 PM  Result Value Ref Range   WBC 27.1 (H) 4.0 - 10.5 K/uL   RBC 6.93 (H) 4.22 - 5.81 MIL/uL   Hemoglobin 20.4 (H) 13.0 - 17.0 g/dL   HCT 40.9 (H) 81.1 - 91.4 %   MCV 88.2 80.0 - 100.0 fL   MCH 29.4 26.0 - 34.0 pg   MCHC 33.4 30.0 - 36.0 g/dL   RDW 78.2 (H) 95.6 - 21.3 %   Platelets 493 (H) 150 - 400 K/uL   nRBC 0.0 0.0 - 0.2 %  Differential     Status: Abnormal    Collection Time: 04/17/22  5:16 PM  Result Value Ref Range   Neutrophils Relative % 87 %   Neutro Abs 23.5 (H) 1.7 - 7.7 K/uL   Lymphocytes Relative 6 %   Lymphs Abs 1.7 0.7 - 4.0 K/uL   Monocytes Relative 6 %   Monocytes Absolute 1.7 (H) 0.1 - 1.0 K/uL   Eosinophils Relative 0 %   Eosinophils Absolute 0.0 0.0 - 0.5 K/uL   Basophils Relative 0 %   Basophils Absolute 0.1 0.0 - 0.1 K/uL   WBC Morphology MORPHOLOGY UNREMARKABLE    Immature Granulocytes 1 %   Abs Immature Granulocytes 0.19 (H) 0.00 - 0.07 K/uL  Comprehensive metabolic panel     Status: Abnormal   Collection Time: 04/17/22  5:16 PM  Result Value Ref Range   Sodium 144 135 - 145 mmol/L   Potassium 4.3 3.5 - 5.1 mmol/L   Chloride 110 98 - 111 mmol/L   CO2 17 (L) 22 - 32 mmol/L   Glucose, Bld 172 (H) 70 - 99 mg/dL   BUN 61 (H) 6 - 20 mg/dL   Creatinine, Ser 0.86 (H) 0.61 - 1.24 mg/dL   Calcium 9.9 8.9 - 57.8 mg/dL   Total Protein 8.5 (H) 6.5 - 8.1 g/dL   Albumin 4.0 3.5 - 5.0 g/dL   AST 83 (H) 15 - 41 U/L   ALT 55 (H) 0 - 44 U/L   Alkaline Phosphatase 83 38 - 126 U/L   Total Bilirubin 0.8 0.3 - 1.2 mg/dL   GFR, Estimated 20 (L) >60 mL/min   Anion gap 17 (H) 5 - 15  Ethanol     Status: None   Collection Time: 04/17/22  5:16 PM  Result Value Ref Range   Alcohol, Ethyl (B) <10 <10 mg/dL  HIV Antibody (routine testing w rflx)     Status: None   Collection Time:  04/17/22  5:16 PM  Result Value Ref Range   HIV Screen 4th Generation wRfx Non Reactive Non Reactive  Magnesium     Status: Abnormal   Collection Time: 04/17/22  5:16 PM  Result Value Ref Range   Magnesium 3.1 (H) 1.7 - 2.4 mg/dL  CK     Status: Abnormal   Collection Time: 04/17/22  5:16 PM  Result Value Ref Range   Total CK 3,583 (H) 49 - 397 U/L  Hepatitis panel, acute     Status: None   Collection Time: 04/17/22  5:16 PM  Result Value Ref Range   Hepatitis B Surface Ag NON REACTIVE NON REACTIVE   HCV Ab NON REACTIVE NON REACTIVE   Hep A IgM NON  REACTIVE NON REACTIVE   Hep B C IgM NON REACTIVE NON REACTIVE  I-stat chem 8, ED     Status: Abnormal   Collection Time: 04/17/22  5:22 PM  Result Value Ref Range   Sodium 145 135 - 145 mmol/L   Potassium 4.2 3.5 - 5.1 mmol/L   Chloride 115 (H) 98 - 111 mmol/L   BUN 61 (H) 6 - 20 mg/dL   Creatinine, Ser 1.61 (H) 0.61 - 1.24 mg/dL   Glucose, Bld 096 (H) 70 - 99 mg/dL   Calcium, Ion 0.45 (L) 1.15 - 1.40 mmol/L   TCO2 18 (L) 22 - 32 mmol/L   Hemoglobin 20.7 (H) 13.0 - 17.0 g/dL   HCT 40.9 (H) 81.1 - 91.4 %  Protime-INR     Status: None   Collection Time: 04/17/22  6:30 PM  Result Value Ref Range   Prothrombin Time 14.8 11.4 - 15.2 seconds   INR 1.2 0.8 - 1.2  APTT     Status: None   Collection Time: 04/17/22  6:30 PM  Result Value Ref Range   aPTT 31 24 - 36 seconds  CBG monitoring, ED     Status: Abnormal   Collection Time: 04/17/22  9:16 PM  Result Value Ref Range   Glucose-Capillary 158 (H) 70 - 99 mg/dL  SARS Coronavirus 2 by RT PCR (hospital order, performed in [facility] hospital lab) *cepheid single result test* Anterior Nasal Swab     Status: None   Collection Time: 04/17/22  9:20 PM   Specimen: Anterior Nasal Swab  Result Value Ref Range   SARS Coronavirus 2 by RT PCR NEGATIVE NEGATIVE  Glucose, capillary     Status: Abnormal   Collection Time: 04/17/22 11:31 PM  Result Value Ref Range   Glucose-Capillary 161 (H) 70 - 99 mg/dL   Comment 1 Notify RN    Comment 2 Document in Chart   Urinalysis, Routine w reflex microscopic In/Out Cath Urine     Status: Abnormal   Collection Time: 04/18/22  2:46 AM  Result Value Ref Range   Color, Urine AMBER (A) YELLOW   APPearance CLOUDY (A) CLEAR   Specific Gravity, Urine >1.046 (H) 1.005 - 1.030   pH 5.0 5.0 - 8.0   Glucose, UA NEGATIVE NEGATIVE mg/dL   Hgb urine dipstick LARGE (A) NEGATIVE   Bilirubin Urine NEGATIVE NEGATIVE   Ketones, ur NEGATIVE NEGATIVE mg/dL   Protein, ur >=782 (A) NEGATIVE mg/dL   Nitrite NEGATIVE  NEGATIVE   Leukocytes,Ua NEGATIVE NEGATIVE   RBC / HPF 6-10 0 - 5 RBC/hpf   WBC, UA 0-5 0 - 5 WBC/hpf   Bacteria, UA RARE (A) NONE SEEN   Squamous Epithelial / LPF 0-5 0 - 5  Mucus PRESENT    Hyaline Casts, UA PRESENT   Sodium, urine, random     Status: None   Collection Time: 04/18/22  2:47 AM  Result Value Ref Range   Sodium, Ur <10 mmol/L  Creatinine, urine, random     Status: None   Collection Time: 04/18/22  2:47 AM  Result Value Ref Range   Creatinine, Urine 447.28 mg/dL  Glucose, capillary     Status: Abnormal   Collection Time: 04/18/22  3:15 AM  Result Value Ref Range   Glucose-Capillary 160 (H) 70 - 99 mg/dL   Comment 1 Notify RN    Comment 2 Document in Chart   CK     Status: Abnormal   Collection Time: 04/18/22  3:35 AM  Result Value Ref Range   Total CK 3,327 (H) 49 - 397 U/L  Procalcitonin - Baseline     Status: None   Collection Time: 04/18/22  3:35 AM  Result Value Ref Range   Procalcitonin 0.14 ng/mL  Lipid panel     Status: Abnormal   Collection Time: 04/18/22  3:54 AM  Result Value Ref Range   Cholesterol 317 (H) 0 - 200 mg/dL   Triglycerides 347 (H) <150 mg/dL   HDL 31 (L) >42 mg/dL   Total CHOL/HDL Ratio 10.2 RATIO   VLDL 69 (H) 0 - 40 mg/dL   LDL Cholesterol 595 (H) 0 - 99 mg/dL  Hemoglobin G3O     Status: Abnormal   Collection Time: 04/18/22  3:54 AM  Result Value Ref Range   Hgb A1c MFr Bld 5.9 (H) 4.8 - 5.6 %   Mean Plasma Glucose 122.63 mg/dL  CBC     Status: Abnormal   Collection Time: 04/18/22  3:54 AM  Result Value Ref Range   WBC 27.6 (H) 4.0 - 10.5 K/uL   RBC 6.09 (H) 4.22 - 5.81 MIL/uL   Hemoglobin 17.7 (H) 13.0 - 17.0 g/dL   HCT 75.6 (H) 43.3 - 29.5 %   MCV 90.1 80.0 - 100.0 fL   MCH 29.1 26.0 - 34.0 pg   MCHC 32.2 30.0 - 36.0 g/dL   RDW 18.8 41.6 - 60.6 %   Platelets 387 150 - 400 K/uL   nRBC 0.0 0.0 - 0.2 %  Comprehensive metabolic panel     Status: Abnormal   Collection Time: 04/18/22  3:54 AM  Result Value Ref Range    Sodium 146 (H) 135 - 145 mmol/L   Potassium 4.0 3.5 - 5.1 mmol/L   Chloride 115 (H) 98 - 111 mmol/L   CO2 19 (L) 22 - 32 mmol/L   Glucose, Bld 141 (H) 70 - 99 mg/dL   BUN 68 (H) 6 - 20 mg/dL   Creatinine, Ser 3.01 (H) 0.61 - 1.24 mg/dL   Calcium 8.8 (L) 8.9 - 10.3 mg/dL   Total Protein 7.0 6.5 - 8.1 g/dL   Albumin 3.3 (L) 3.5 - 5.0 g/dL   AST 75 (H) 15 - 41 U/L   ALT 47 (H) 0 - 44 U/L   Alkaline Phosphatase 66 38 - 126 U/L   Total Bilirubin 0.9 0.3 - 1.2 mg/dL   GFR, Estimated 24 (L) >60 mL/min   Anion gap 12 5 - 15  Lactic acid, plasma     Status: None   Collection Time: 04/18/22  3:54 AM  Result Value Ref Range   Lactic Acid, Venous 1.3 0.5 - 1.9 mmol/L  Glucose, capillary     Status: Abnormal  Collection Time: 04/18/22  7:42 AM  Result Value Ref Range   Glucose-Capillary 149 (H) 70 - 99 mg/dL  Glucose, capillary     Status: Abnormal   Collection Time: 04/18/22 12:12 PM  Result Value Ref Range   Glucose-Capillary 159 (H) 70 - 99 mg/dL    I have Reviewed nursing notes, Vitals, and Lab results since pt's last encounter. Pertinent lab results : see above I have ordered test including BMP, CBC, Mg I have reviewed the last note from staff over past 24 hours I have discussed pt's care plan and test results with nursing staff, case manager   LOS: 1 day   Lewie Chamber, MD [facility] 04/18/2022, 1:53 PM

## 2022-04-18 NOTE — Assessment & Plan Note (Addendum)
-   baseline creatinine ~ 1 - patient presents with increase in creat >0.3 mg/dL above baseline, creat increase >1.5x baseline presumed to have occurred within past 7 days PTA - differential includes pre-renal from no intake ~2-3 days vs pigment nephropathy from rhabdo vs ATN from prolonged pre-renal (still at risk) - creat 3.39 on admission; appears to have peaked at 3.5 - renal u/s unremarkable  - continue IVF and trend BMP  -Renal function improving with fluids

## 2022-04-19 ENCOUNTER — Inpatient Hospital Stay (HOSPITAL_COMMUNITY): Payer: BC Managed Care – PPO

## 2022-04-19 DIAGNOSIS — I639 Cerebral infarction, unspecified: Secondary | ICD-10-CM | POA: Diagnosis not present

## 2022-04-19 DIAGNOSIS — M6282 Rhabdomyolysis: Secondary | ICD-10-CM

## 2022-04-19 DIAGNOSIS — N179 Acute kidney failure, unspecified: Secondary | ICD-10-CM

## 2022-04-19 DIAGNOSIS — E782 Mixed hyperlipidemia: Secondary | ICD-10-CM | POA: Diagnosis not present

## 2022-04-19 DIAGNOSIS — D72829 Elevated white blood cell count, unspecified: Secondary | ICD-10-CM

## 2022-04-19 DIAGNOSIS — R7989 Other specified abnormal findings of blood chemistry: Secondary | ICD-10-CM

## 2022-04-19 LAB — URINE CULTURE: Culture: NO GROWTH

## 2022-04-19 LAB — CBC WITH DIFFERENTIAL/PLATELET
Abs Immature Granulocytes: 0.09 10*3/uL — ABNORMAL HIGH (ref 0.00–0.07)
Basophils Absolute: 0 10*3/uL (ref 0.0–0.1)
Basophils Relative: 0 %
Eosinophils Absolute: 0 10*3/uL (ref 0.0–0.5)
Eosinophils Relative: 0 %
HCT: 52.9 % — ABNORMAL HIGH (ref 39.0–52.0)
Hemoglobin: 17 g/dL (ref 13.0–17.0)
Immature Granulocytes: 1 %
Lymphocytes Relative: 11 %
Lymphs Abs: 2.1 10*3/uL (ref 0.7–4.0)
MCH: 29.3 pg (ref 26.0–34.0)
MCHC: 32.1 g/dL (ref 30.0–36.0)
MCV: 91 fL (ref 80.0–100.0)
Monocytes Absolute: 1.4 10*3/uL — ABNORMAL HIGH (ref 0.1–1.0)
Monocytes Relative: 8 %
Neutro Abs: 14.9 10*3/uL — ABNORMAL HIGH (ref 1.7–7.7)
Neutrophils Relative %: 80 %
Platelets: 338 10*3/uL (ref 150–400)
RBC: 5.81 MIL/uL (ref 4.22–5.81)
RDW: 14.2 % (ref 11.5–15.5)
WBC: 18.7 10*3/uL — ABNORMAL HIGH (ref 4.0–10.5)
nRBC: 0 % (ref 0.0–0.2)

## 2022-04-19 LAB — COMPREHENSIVE METABOLIC PANEL
ALT: 54 U/L — ABNORMAL HIGH (ref 0–44)
AST: 80 U/L — ABNORMAL HIGH (ref 15–41)
Albumin: 3.2 g/dL — ABNORMAL LOW (ref 3.5–5.0)
Alkaline Phosphatase: 71 U/L (ref 38–126)
Anion gap: 11 (ref 5–15)
BUN: 44 mg/dL — ABNORMAL HIGH (ref 6–20)
CO2: 21 mmol/L — ABNORMAL LOW (ref 22–32)
Calcium: 8.3 mg/dL — ABNORMAL LOW (ref 8.9–10.3)
Chloride: 117 mmol/L — ABNORMAL HIGH (ref 98–111)
Creatinine, Ser: 1.73 mg/dL — ABNORMAL HIGH (ref 0.61–1.24)
GFR, Estimated: 45 mL/min — ABNORMAL LOW (ref >60)
Glucose, Bld: 130 mg/dL — ABNORMAL HIGH (ref 70–99)
Potassium: 3.8 mmol/L (ref 3.5–5.1)
Sodium: 149 mmol/L — ABNORMAL HIGH (ref 135–145)
Total Bilirubin: 0.8 mg/dL (ref 0.3–1.2)
Total Protein: 6.5 g/dL (ref 6.5–8.1)

## 2022-04-19 LAB — GLUCOSE, CAPILLARY
Glucose-Capillary: 129 mg/dL — ABNORMAL HIGH (ref 70–99)
Glucose-Capillary: 164 mg/dL — ABNORMAL HIGH (ref 70–99)
Glucose-Capillary: 116 mg/dL — ABNORMAL HIGH (ref 70–99)
Glucose-Capillary: 146 mg/dL — ABNORMAL HIGH (ref 70–99)
Glucose-Capillary: 141 mg/dL — ABNORMAL HIGH (ref 70–99)

## 2022-04-19 LAB — RAPID URINE DRUG SCREEN, HOSP PERFORMED
Amphetamines: NOT DETECTED
Barbiturates: NOT DETECTED
Benzodiazepines: NOT DETECTED
Cocaine: NOT DETECTED
Opiates: NOT DETECTED
Tetrahydrocannabinol: NOT DETECTED

## 2022-04-19 LAB — PROCALCITONIN: Procalcitonin: <0.1 ng/mL

## 2022-04-19 LAB — MAGNESIUM: Magnesium: 3.1 mg/dL — ABNORMAL HIGH (ref 1.7–2.4)

## 2022-04-19 LAB — PHOSPHORUS
Phosphorus: 4.5 mg/dL (ref 2.5–4.6)
Phosphorus: 4.1 mg/dL (ref 2.5–4.6)

## 2022-04-19 LAB — CK: Total CK: 2784 U/L — ABNORMAL HIGH (ref 49–397)

## 2022-04-19 MED ORDER — PROSOURCE TF PO LIQD
45.0000 mL | Freq: Every day | ORAL | Status: DC
  Administered 2022-04-19 – 2022-04-23 (×5): 45 mL
  Filled 2022-04-19 (×5): qty 45

## 2022-04-19 MED ORDER — JEVITY 1.2 CAL PO LIQD
1000.0000 mL | ORAL | Status: DC
  Administered 2022-04-19: 20 mL
  Administered 2022-04-20 – 2022-04-22 (×5): 1000 mL

## 2022-04-19 MED ORDER — VANCOMYCIN HCL 1250 MG/250ML IV SOLN
1250.0000 mg | INTRAVENOUS | Status: DC
  Administered 2022-04-19: 1250 mg via INTRAVENOUS
  Filled 2022-04-19 (×2): qty 250

## 2022-04-19 MED ORDER — AMLODIPINE BESYLATE 2.5 MG PO TABS
2.5000 mg | ORAL_TABLET | Freq: Every day | ORAL | Status: DC
  Administered 2022-04-19: 2.5 mg via ORAL
  Filled 2022-04-19: qty 1

## 2022-04-19 MED ORDER — METOPROLOL TARTRATE 25 MG PO TABS
25.0000 mg | ORAL_TABLET | Freq: Two times a day (BID) | ORAL | Status: DC
Start: 2022-04-19 — End: 2022-04-21
  Administered 2022-04-19 – 2022-04-20 (×3): 25 mg
  Filled 2022-04-19 (×3): qty 1

## 2022-04-19 MED ORDER — ASPIRIN 81 MG PO CHEW
81.0000 mg | CHEWABLE_TABLET | Freq: Every day | ORAL | Status: DC
  Administered 2022-04-20 – 2022-04-23 (×4): 81 mg
  Filled 2022-04-19 (×4): qty 1

## 2022-04-19 MED ORDER — AMLODIPINE BESYLATE 10 MG PO TABS
10.0000 mg | ORAL_TABLET | Freq: Every day | ORAL | Status: DC
  Administered 2022-04-20 – 2022-04-23 (×4): 10 mg
  Filled 2022-04-19 (×4): qty 1

## 2022-04-19 MED ORDER — SODIUM CHLORIDE 0.9 % IV SOLN
2.0000 g | Freq: Two times a day (BID) | INTRAVENOUS | Status: DC
  Administered 2022-04-19 – 2022-04-20 (×3): 2 g via INTRAVENOUS
  Filled 2022-04-19 (×3): qty 12.5

## 2022-04-19 MED ORDER — CLOPIDOGREL BISULFATE 75 MG PO TABS
75.0000 mg | ORAL_TABLET | Freq: Every day | ORAL | Status: DC
  Administered 2022-04-19 – 2022-04-23 (×5): 75 mg
  Filled 2022-04-19 (×5): qty 1

## 2022-04-19 MED ORDER — AMLODIPINE BESYLATE 10 MG PO TABS
10.0000 mg | ORAL_TABLET | Freq: Every day | ORAL | Status: DC
Start: 2022-04-19 — End: 2022-04-19

## 2022-04-19 MED ORDER — LABETALOL HCL 5 MG/ML IV SOLN
10.0000 mg | INTRAVENOUS | Status: DC | PRN
  Administered 2022-04-19 – 2022-04-20 (×2): 10 mg via INTRAVENOUS
  Filled 2022-04-19 (×2): qty 4

## 2022-04-19 MED ORDER — ADULT MULTIVITAMIN W/MINERALS CH
1.0000 | ORAL_TABLET | Freq: Every day | ORAL | Status: DC
  Administered 2022-04-19 – 2022-04-23 (×5): 1
  Filled 2022-04-19 (×5): qty 1

## 2022-04-19 MED ORDER — ROSUVASTATIN CALCIUM 20 MG PO TABS
40.0000 mg | ORAL_TABLET | Freq: Every day | ORAL | Status: DC
  Administered 2022-04-19 – 2022-04-23 (×5): 40 mg
  Filled 2022-04-19 (×5): qty 2

## 2022-04-20 DIAGNOSIS — E860 Dehydration: Secondary | ICD-10-CM

## 2022-04-20 DIAGNOSIS — N179 Acute kidney failure, unspecified: Secondary | ICD-10-CM | POA: Diagnosis not present

## 2022-04-20 DIAGNOSIS — I639 Cerebral infarction, unspecified: Secondary | ICD-10-CM | POA: Diagnosis not present

## 2022-04-20 DIAGNOSIS — R1312 Dysphagia, oropharyngeal phase: Secondary | ICD-10-CM | POA: Diagnosis not present

## 2022-04-20 LAB — CBC WITH DIFFERENTIAL/PLATELET
Abs Immature Granulocytes: 0.12 10*3/uL — ABNORMAL HIGH (ref 0.00–0.07)
Basophils Absolute: 0 10*3/uL (ref 0.0–0.1)
Basophils Relative: 0 %
Eosinophils Absolute: 0 10*3/uL (ref 0.0–0.5)
Eosinophils Relative: 0 %
HCT: 50.3 % (ref 39.0–52.0)
Hemoglobin: 15.9 g/dL (ref 13.0–17.0)
Immature Granulocytes: 1 %
Lymphocytes Relative: 18 %
Lymphs Abs: 2.2 10*3/uL (ref 0.7–4.0)
MCH: 28.7 pg (ref 26.0–34.0)
MCHC: 31.6 g/dL (ref 30.0–36.0)
MCV: 90.8 fL (ref 80.0–100.0)
Monocytes Absolute: 1 10*3/uL (ref 0.1–1.0)
Monocytes Relative: 8 %
Neutro Abs: 9.1 10*3/uL — ABNORMAL HIGH (ref 1.7–7.7)
Neutrophils Relative %: 73 %
Platelets: 284 10*3/uL (ref 150–400)
RBC: 5.54 MIL/uL (ref 4.22–5.81)
RDW: 13.9 % (ref 11.5–15.5)
WBC: 12.5 10*3/uL — ABNORMAL HIGH (ref 4.0–10.5)
nRBC: 0 % (ref 0.0–0.2)

## 2022-04-20 LAB — COMPREHENSIVE METABOLIC PANEL
ALT: 69 U/L — ABNORMAL HIGH (ref 0–44)
AST: 83 U/L — ABNORMAL HIGH (ref 15–41)
Albumin: 3.1 g/dL — ABNORMAL LOW (ref 3.5–5.0)
Alkaline Phosphatase: 60 U/L (ref 38–126)
Anion gap: 11 (ref 5–15)
BUN: 29 mg/dL — ABNORMAL HIGH (ref 6–20)
CO2: 23 mmol/L (ref 22–32)
Calcium: 8.7 mg/dL — ABNORMAL LOW (ref 8.9–10.3)
Chloride: 120 mmol/L — ABNORMAL HIGH (ref 98–111)
Creatinine, Ser: 1.39 mg/dL — ABNORMAL HIGH (ref 0.61–1.24)
GFR, Estimated: 59 mL/min — ABNORMAL LOW (ref >60)
Glucose, Bld: 160 mg/dL — ABNORMAL HIGH (ref 70–99)
Potassium: 3.5 mmol/L (ref 3.5–5.1)
Sodium: 154 mmol/L — ABNORMAL HIGH (ref 135–145)
Total Bilirubin: 0.7 mg/dL (ref 0.3–1.2)
Total Protein: 6.4 g/dL — ABNORMAL LOW (ref 6.5–8.1)

## 2022-04-20 LAB — PROCALCITONIN: Procalcitonin: <0.1 ng/mL

## 2022-04-20 LAB — GLUCOSE, CAPILLARY
Glucose-Capillary: 139 mg/dL — ABNORMAL HIGH (ref 70–99)
Glucose-Capillary: 143 mg/dL — ABNORMAL HIGH (ref 70–99)
Glucose-Capillary: 154 mg/dL — ABNORMAL HIGH (ref 70–99)
Glucose-Capillary: 160 mg/dL — ABNORMAL HIGH (ref 70–99)
Glucose-Capillary: 163 mg/dL — ABNORMAL HIGH (ref 70–99)
Glucose-Capillary: 173 mg/dL — ABNORMAL HIGH (ref 70–99)
Glucose-Capillary: 188 mg/dL — ABNORMAL HIGH (ref 70–99)

## 2022-04-20 LAB — PHOSPHORUS
Phosphorus: 3 mg/dL (ref 2.5–4.6)
Phosphorus: 2.5 mg/dL (ref 2.5–4.6)

## 2022-04-20 LAB — MAGNESIUM
Magnesium: 3 mg/dL — ABNORMAL HIGH (ref 1.7–2.4)
Magnesium: 2.9 mg/dL — ABNORMAL HIGH (ref 1.7–2.4)

## 2022-04-20 LAB — CK: Total CK: 1540 U/L — ABNORMAL HIGH (ref 49–397)

## 2022-04-20 MED ORDER — SODIUM CHLORIDE 0.45 % IV SOLN: Freq: Once | INTRAVENOUS | Status: AC

## 2022-04-20 NOTE — Progress Notes (Addendum)
STROKE TEAM PROGRESS NOTE   INTERVAL HISTORY Patient brother and sister in law are at bedside.  Patient sitting in chair, more awake alert, eyes open, however still dense expressive aphasia, follows all simple commands, still has dense right hemiplegia.   Vitals:   04/20/22 0805 04/20/22 0841 04/20/22 0910 04/20/22 1100  BP: (!) 214/79 (!) 221/87 (!) 168/92 (!) 190/81  Pulse: 87   94  Resp: 17 19 20  (!) 24  Temp: 99.9 F (37.7 C)   98.9 F (37.2 C)  TempSrc: Axillary   Oral  SpO2: 96%     Weight:      Height:       CBC:  Recent Labs  Lab 04/19/22 0021 04/20/22 0747  WBC 18.7* 12.5*  NEUTROABS 14.9* 9.1*  HGB 17.0 15.9  HCT 52.9* 50.3  MCV 91.0 90.8  PLT 338 284   Basic Metabolic Panel:  Recent Labs  Lab 04/19/22 0021 04/19/22 1657 04/20/22 0747  NA 149*  --  154*  K 3.8  --  3.5  CL 117*  --  120*  CO2 21*  --  23  GLUCOSE 130*  --  160*  BUN 44*  --  29*  CREATININE 1.73*  --  1.39*  CALCIUM 8.3*  --  8.7*  MG 3.1*  --  3.0*  PHOS 4.5 4.1 3.0   Lipid Panel:  Recent Labs  Lab 04/18/22 0354  CHOL 317*  TRIG 345*  HDL 31*  CHOLHDL 10.2  VLDL 69*  LDLCALC 217*   HgbA1c:  Recent Labs  Lab 04/18/22 0354  HGBA1C 5.9*   Urine Drug Screen:  Recent Labs  Lab 04/18/22 1528  LABOPIA NONE DETECTED  COCAINSCRNUR NONE DETECTED  LABBENZ NONE DETECTED  AMPHETMU NONE DETECTED  THCU NONE DETECTED  LABBARB NONE DETECTED    Alcohol Level  Recent Labs  Lab 04/17/22 1716  ETH <10    IMAGING past 24 hours DG Abd Portable 1V  Result Date: 04/19/2022 CLINICAL DATA:  Feeding tube placement. EXAM: PORTABLE ABDOMEN - 1 VIEW COMPARISON:  None Available. FINDINGS: Tip of a small bore feeding tube is in the distal stomach or more likely duodenal bulb. Lung bases are clear.  Bowel gas pattern is otherwise unremarkable. IMPRESSION: Tip of small bore feeding tube in the distal stomach or duodenal bulb. Electronically Signed   By: Marin Grant M.D.   On:  04/19/2022 14:10   DG CHEST PORT 1 VIEW  Result Date: 04/19/2022 CLINICAL DATA:  Chest pain EXAM: PORTABLE CHEST 1 VIEW COMPARISON:  04/17/2022 FINDINGS: No focal consolidation. No pleural effusion or pneumothorax. Heart and mediastinal contours are unremarkable. No acute osseous abnormality. IMPRESSION: No active disease. Electronically Signed   By: Alex Grant M.D.   On: 04/19/2022 13:19    PHYSICAL EXAM General:  more awake alert, well-nourished, well-developed patient in no acute distress Respiratory: Regular, unlabored respirations on supplemental O2 Heart: regular rhythm and rate Neurological:  pt lethargic with eyes open spontaneously, no language output but following all simple commands with mild psychomotor slowing. Not able to name and repeat. Left gaze palsy preference but able to tracking bilaterally, has right lower quadranopia, right facial droop. Tongue midline. LUE and LLE 3+/5 but slow on movement, right UE 1/5 and RLE 1/5. Sensation not cooperative, left FTN intact grossly but slow on action and gait not tested.  ASSESSMENT/Grant [name] is a 56 y.o. male with history of hyperlipidemia and hypertension presenting after being found down at  home with right sided hemiplegia, left gaze deviation and aphasia.  His last known well time was reported to be 1800 on Friday, but this is unclear.  MRI shows acute ischemic stroke in the left basal ganglia and corona radiata.  Stroke: left BG/CR large infarct likely secondary small vessel disease in setting of uncontrolled risk factors. However, cardioembolic source can not be completely ruled out Code Stroke CT head Acute left basal ganglia infarct ASPECTS 7   CTA head & neck No LVO or hemodynamically significant stenosis CT perfusion infarct not seen, likely due to it being 24 hours old MRI acute perforator infarct in left basal ganglia and corona radiata, chronic small vessel ischemia 2D Echo EF 65-70%  Consider 30-day cardiac  event monitoring as outpatient to rule out A-fib LDL 217 HgbA1c 5.9 UDS neg VTE prophylaxis - heparin SQH ASA 81 (not compliant with meds) prior to admission, now on DAPT for 3 weeks and then ASA alone Therapy recommendations:  CIR Disposition:  pending  Rhabdomyolysis AKI Dehydration Transaminitis Leukocytosis CK 3,583->3,327->2784->1540 Cre 3.39->2.95->1.73->1.39 WBC 27.6->18.7->12.5 AST/ALT 75/47->80/54->83/69 Blood culture NGTD On IV fluid @ 50 and TF @ 70 On cefepime Flagyl and vancomycin  Hypertensive urgency Home meds:  Not compliant with medication at home per family Unstable and high, improving Put on amlodipine 10 and metoprolol 25 bid Gradually normalize BP in 2 to 3 days  Long-term BP goal normotensive  Hyperlipidemia Family reported poor diet at home Home meds: none LDL 217, goal < 70 TG 345 On crestor 40 Continue statin at discharge  Hypernatremia  Na 146->149->154 On IV fluid @ 50 and TF @ 70 Management per primary team  Dysphagia Did not pass swallow S/p ortrak Speech on board On TF @ 70cc  Other Stroke Risk Factors Non compliant with medication Poor diet  Other Active Problems Right thyroid nodule, recommend outpatient thyroid ultrasound  Hospital day # 3  Neurology will sign off. Please call with questions. Pt will follow up with stroke clinic NP at [facility] in about 4 weeks. Thanks for the consult.   Alex Plan, MD PhD Stroke Neurology 04/20/2022 12:30 PM      To contact Stroke Continuity provider, please refer to [email or web address]. After hours, contact General Neurology

## 2022-04-20 NOTE — Progress Notes (Signed)
Family had the insurance card, copy was kept in the chart. Pt is also scheduled to be on a [employer] on August 3, the family is requesting for a letter from the doctor to excuse the pt for the jury duty.

## 2022-04-20 NOTE — Progress Notes (Signed)
PROGRESS NOTE    Alex Grant  VQQ:[id number] DOB: Oct 12, 1966 DOA: 04/17/2022 PCP: Patient, No Pcp Per   Brief Narrative:  Alex Grant is a 56 yo male with PMH HTN, HLD, prediabetes who presented via EMS after being found down at home.  Patient was unable to provide any information due to unresponsiveness on admission and all collateral information was obtained from family.  It appears he was last communicated with on Thursday via phone conversation with his dad.  When EMS found him, he was still in bed when found during welfare check on Saturday.  CT head showed acute infarct involving left basal ganglia, no hemorrhage.  MRI brain also performed after admission which also confirmed acute infarct involving left basal ganglia and corona radiata.    Assessment & Plan:   Principal Problem:   Acute ischemic stroke (HCC) Active Problems:   SIRS (systemic inflammatory response syndrome) (HCC)   Dysphagia   AKI (acute kidney injury) (HCC)   Rhabdomyolysis   Metabolic acidosis   Elevated LFTs   Hypertension   Prolonged QT interval   Prediabetes   Right thyroid nodule   HLD (hyperlipidemia)  Acute ischemic stroke (HCC) - CT head and MRI brain revealed left basal ganglia and corona radiata stroke - Patient has dense right-sided hemiparesis and appears to have right-sided neglect and mutism -can communicates her left hand thumbs up thumbs down and writing - continue DAPT per neuro (asa for 3 weeks then continue on Plavix only) - Cortrak placement on 6/26 -we will need to discuss definitive enteral nutrition if speech evaluation continues to be poor - statin on hold until LFTs improve further  - echo reviewed; will plan on 30 day monitor as well at discharge per neuro rec's - If no significant improvement in the next 1 to 2 days or any further decline, low threshold for consulting palliative care.  Family at this time still wishing to remain semi aggressive and is hopeful for some recovery    Dysphagia - Secondary to above - cortrak placed for feeds - would likely need to progress to PEG tube if patient does not improve   Hypertensive emergency - Permissive hypertension for now in setting of large stroke.  Will bring down slowly over next few days -Goal systolic blood pressure 130-140 in the next 24 to 48 hours  SIRS (systemic inflammatory response syndrome) (HCC) -Procalcitonin negative, likely reactive in the setting of large stroke as above as well as profound rhabdomyolysis -Single fever recorded at intake, mild and resolved without treatment -Discontinue all antibiotics, follow clinically -Imaging and work-up thus far remain negative -Pneumonia/aspiration is most likely cause of infection at this time however chest x-ray and symptoms as well as clinical exam are unremarkable for overt pneumonia   Rhabdomyolysis - presumed from prolonged downtime; family reports EMS found him in bed - CK 3,583 on admission - continue trending CK (3583 >> 3327>>2784>>1540) - continue IVF/free water flushes and tube feeds   AKI (acute kidney injury) (HCC) - baseline creatinine ~ 1 - Likely secondary to profound rhabdo vs ATN from prolonged pre-renal (still at risk) -Renal function downtrending appropriately   HLD (hyperlipidemia) - CHL 317, TG 345, HDL 31, LDL 217 - Will eventually need statin but on hold for now in setting of elevated LFTs   Right thyroid nodule - Incidentally noted on CT on admission - 2.2 cm right thyroid nodule - Outpatient thyroid ultrasound and further work-up   Prediabetes - A1c 5.9% - Diet control once  able to initiate tube feeds   Prolonged QT interval - QTtc 519 on EKG - monitor K and Mg     Elevated LFTs - elevated AST/ALT. No signs of obstructive pattern - suspected from acute illness/rhabdo -Hepatitis panel negative - hold off on statin for CVA until improvement - continue trending    Metabolic acidosis - See AKI and rhabdomyolysis -  Continue fluids - Trend BMP  DVT prophylaxis: Heparin Code Status: Full Family Communication: None present  Status is: Inpatient  Dispo: The patient is from: Home              Anticipated d/c is to: To be determined              Anticipated d/c date is: 72+ hours              Patient currently not medically stable for discharge  Consultants:  Neurology  Procedures:  None  Antimicrobials:  Discontinued 627  Subjective: No acute issues or events overnight, discussion limited with patient but denies fevers chills chest pain or shortness of breath.  Objective: Vitals:   04/19/22 1550 04/19/22 2000 04/19/22 2318 04/20/22 0400  BP:  (!) 208/87 (!) 203/95 (!) 198/72  Pulse: 86 88 81 71  Resp:  (!) 23 (!) 21 14  Temp: 98.4 F (36.9 C) 98.2 F (36.8 C) 98.5 F (36.9 C) 98.8 F (37.1 C)  TempSrc: Oral Oral Oral Oral  SpO2:  93% 97% 97%  Weight:      Height:        Intake/Output Summary (Last 24 hours) at 04/20/2022 0737 Last data filed at 04/20/2022 0600 Gross per 24 hour  Intake 1612.5 ml  Output 1800 ml  Net -187.5 ml   Filed Weights   04/17/22 1700  Weight: 85 kg    Examination:  General:  Pleasantly resting in bed, No acute distress. HEENT: Expressive aphasia without airway compromise, core track NG tube in place Lungs:  Clear to auscultate bilaterally without rhonchi, wheeze, or rales. Heart:  Regular rate and rhythm.  Without murmurs, rubs, or gallops. Abdomen:  Soft, nontender, nondistended.  Without guarding or rebound. Extremities: Profound right-sided weakness 1 out of 5 strength globally Vascular:  Dorsalis pedis and posterior tibial pulses palpable bilaterally. Skin:  Warm and dry, no erythema, no ulcerations.    Data Reviewed: I have personally reviewed following labs and imaging studies  CBC: Recent Labs  Lab 04/17/22 1716 04/17/22 1722 04/18/22 0354 04/19/22 0021  WBC 27.1*  --  27.6* 18.7*  NEUTROABS 23.5*  --   --  14.9*  HGB 20.4*  20.7* 17.7* 17.0  HCT 61.1* 61.0* 54.9* 52.9*  MCV 88.2  --  90.1 91.0  PLT 493*  --  387 338   Basic Metabolic Panel: Recent Labs  Lab 04/17/22 1716 04/17/22 1722 04/18/22 0354 04/19/22 0021 04/19/22 1657  NA 144 145 146* 149*  --   K 4.3 4.2 4.0 3.8  --   CL 110 115* 115* 117*  --   CO2 17*  --  19* 21*  --   GLUCOSE 172* 171* 141* 130*  --   BUN 61* 61* 68* 44*  --   CREATININE 3.39* 3.50* 2.95* 1.73*  --   CALCIUM 9.9  --  8.8* 8.3*  --   MG 3.1*  --   --  3.1*  --   PHOS  --   --   --  4.5 4.1   GFR: Estimated Creatinine Clearance:  50 mL/min (A) (by C-G formula based on SCr of 1.73 mg/dL (H)). Liver Function Tests: Recent Labs  Lab 04/17/22 1716 04/18/22 0354 04/19/22 0021  AST 83* 75* 80*  ALT 55* 47* 54*  ALKPHOS 83 66 71  BILITOT 0.8 0.9 0.8  PROT 8.5* 7.0 6.5  ALBUMIN 4.0 3.3* 3.2*   No results for input(s): "LIPASE", "AMYLASE" in the last 168 hours. No results for input(s): "AMMONIA" in the last 168 hours. Coagulation Profile: Recent Labs  Lab 04/17/22 1716 04/17/22 1830  INR SPECIMEN/CONTAINER TYPE INAPPROPRIATE FOR ORDERED TEST, UNABLE TO PERFORM 1.2   Cardiac Enzymes: Recent Labs  Lab 04/17/22 1716 04/18/22 0335 04/19/22 0021  CKTOTAL 3,583* 3,327* 2,784*   BNP (last 3 results) No results for input(s): "PROBNP" in the last 8760 hours. HbA1C: Recent Labs    04/18/22 0354  HGBA1C 5.9*   CBG: Recent Labs  Lab 04/19/22 1156 04/19/22 1554 04/19/22 1959 04/20/22 0003 04/20/22 0341  GLUCAP 116* 146* 141* 143* 154*   Lipid Profile: Recent Labs    04/18/22 0354  CHOL 317*  HDL 31*  LDLCALC 217*  TRIG 345*  CHOLHDL 10.2   Thyroid Function Tests: No results for input(s): "TSH", "T4TOTAL", "FREET4", "T3FREE", "THYROIDAB" in the last 72 hours. Anemia Panel: No results for input(s): "VITAMINB12", "FOLATE", "FERRITIN", "TIBC", "IRON", "RETICCTPCT" in the last 72 hours. Sepsis Labs: Recent Labs  Lab 04/18/22 0335 04/18/22 0354  04/19/22 0021  PROCALCITON 0.14  --  <0.10  LATICACIDVEN  --  1.3  --     Recent Results (from the past 240 hour(s))  SARS Coronavirus 2 by RT PCR (hospital order, performed in [facility] hospital lab) *cepheid single result test* Anterior Nasal Swab     Status: None   Collection Time: 04/17/22  9:20 PM   Specimen: Anterior Nasal Swab  Result Value Ref Range Status   SARS Coronavirus 2 by RT PCR NEGATIVE NEGATIVE Final    Comment: (NOTE) SARS-CoV-2 target nucleic acids are NOT DETECTED.  The SARS-CoV-2 RNA is generally detectable in upper and lower respiratory specimens during the acute phase of infection. The lowest concentration of SARS-CoV-2 viral copies this assay can detect is 250 copies / mL. A negative result does not preclude SARS-CoV-2 infection and should not be used as the sole basis for treatment or other patient management decisions.  A negative result may occur with improper specimen collection / handling, submission of specimen other than nasopharyngeal swab, presence of viral mutation(s) within the areas targeted by this assay, and inadequate number of viral copies (<250 copies / mL). A negative result must be combined with clinical observations, patient history, and epidemiological information.  Fact Sheet for Patients:   RoadLapTop.co.za  Fact Sheet for Healthcare Providers: http://kim-miller.com/  This test is not yet approved or  cleared by the [place] FDA and has been authorized for detection and/or diagnosis of SARS-CoV-2 by FDA under an Emergency Use Authorization (EUA).  This EUA will remain in effect (meaning this test can be used) for the duration of the COVID-19 declaration under Section 564(b)(1) of the Act, 21 U.S.C. section 360bbb-3(b)(1), unless the authorization is terminated or revoked sooner.  Performed at [facility], [address] [place], [place] [address]   Urine Culture      Status: None   Collection Time: 04/18/22  2:47 AM   Specimen: In/Out Cath Urine  Result Value Ref Range Status   Specimen Description IN/OUT CATH URINE  Final   Special Requests  NONE  Final   Culture   Final    NO GROWTH Performed at [facility], [address], [place], [place] [address]    Report Status 04/19/2022 FINAL  Final  Culture, blood (Routine X 2) w Reflex to ID Panel     Status: None (Preliminary result)   Collection Time: 04/18/22  3:54 AM   Specimen: BLOOD RIGHT HAND  Result Value Ref Range Status   Specimen Description BLOOD RIGHT HAND  Final   Special Requests   Final    BOTTLES DRAWN AEROBIC AND ANAEROBIC Blood Culture adequate volume   Culture   Final    NO GROWTH 1 DAY Performed at [facility], [address], [place], [place] [address]    Report Status PENDING  Incomplete  Culture, blood (Routine X 2) w Reflex to ID Panel     Status: None (Preliminary result)   Collection Time: 04/18/22  3:54 AM   Specimen: BLOOD  Result Value Ref Range Status   Specimen Description BLOOD RIGHT ANTECUBITAL  Final   Special Requests   Final    BOTTLES DRAWN AEROBIC AND ANAEROBIC Blood Culture adequate volume   Culture   Final    NO GROWTH 1 DAY Performed at [facility], [address], [place], [place] [address]    Report Status PENDING  Incomplete         Radiology Studies: DG Abd Portable 1V  Result Date: 04/19/2022 CLINICAL DATA:  Feeding tube placement. EXAM: PORTABLE ABDOMEN - 1 VIEW COMPARISON:  None Available. FINDINGS: Tip of a small bore feeding tube is in the distal stomach or more likely duodenal bulb. Lung bases are clear.  Bowel gas pattern is otherwise unremarkable. IMPRESSION: Tip of small bore feeding tube in the distal stomach or duodenal bulb. Electronically Signed   By: Marin Roberts M.D.   On: 04/19/2022 14:10   DG CHEST PORT 1 VIEW  Result Date: 04/19/2022 CLINICAL DATA:  Chest pain EXAM: PORTABLE CHEST 1 VIEW  COMPARISON:  04/17/2022 FINDINGS: No focal consolidation. No pleural effusion or pneumothorax. Heart and mediastinal contours are unremarkable. No acute osseous abnormality. IMPRESSION: No active disease. Electronically Signed   By: Elige Ko M.D.   On: 04/19/2022 13:19   MR BRAIN WO CONTRAST  Result Date: 04/18/2022 CLINICAL DATA:  Stroke workup EXAM: MRI HEAD WITHOUT CONTRAST TECHNIQUE: Multiplanar, multiecho pulse sequences of the brain and surrounding structures were obtained without intravenous contrast. COMPARISON:  Head CT and CTA from yesterday FINDINGS: Brain: Wedge of restricted diffusion traversing the left stratum and corona radiata. A focus of diffusion hyperintensity along the ventral right frontal lobe is likely susceptibility artifact based on coronal diffusion acquisition. No hemorrhage, hydrocephalus, mass, or collection. Generalized cerebral volume loss, notable for age. Chronic small vessel ischemic type change in the hemispheric white matter with chronic lacune in the left frontal white matter, right thalamus, and in the pons. Vascular: Major flow voids are preserved.  There was preceding CTA Skull and upper cervical spine: No focal marrow lesion. Sinuses/Orbits: Patchy mucosal thickening throughout the paranasal sinuses, extensive. Other: Intermittent motion degradation. IMPRESSION: 1. Acute perforator infarct at the left basal ganglia and corona radiata. 2. Chronic small vessel ischemia. 3. Generalized sinusitis. Electronically Signed   By: Tiburcio Pea M.D.   On: 04/18/2022 11:12   ECHOCARDIOGRAM COMPLETE  Result Date: 04/18/2022    ECHOCARDIOGRAM REPORT   Patient Name:   LARNEY SEACHRIST Date of Exam: 04/18/2022 Medical Rec #:  [id number]    Height:       68.0 in Accession #:    [id number]   Weight:       187.4 lb Date of Birth:  Sep 04, 1966    BSA:          1.988 m Patient Age:    55 years     BP:           161/78 mmHg Patient Gender: M            HR:           90 bpm. Exam Location:   Inpatient Procedure: 2D Echo, Cardiac Doppler and Color Doppler Indications:    Stroke  History:        Patient has no prior history of Echocardiogram examinations.                 Risk Factors:Hypertension and Dyslipidemia. Pre-diabetes.  Sonographer:    Ross Ludwig RDCS (AE) Referring Phys: [id number] TIMOTHY S OPYD IMPRESSIONS  1. Left ventricular ejection fraction, by estimation, is 65 to 70%. The left ventricle has normal function. The left ventricle has no regional wall motion abnormalities. There is severe left ventricular hypertrophy. Left ventricular diastolic parameters  are consistent with Grade I diastolic dysfunction (impaired relaxation). Elevated left atrial pressure.  2. Right ventricular systolic function is normal. The right ventricular size is normal.  3. The mitral valve is normal in structure. No evidence of mitral valve regurgitation. No evidence of mitral stenosis.  4. The aortic valve is calcified. There is mild calcification of the aortic valve. There is mild thickening of the aortic valve. Aortic valve regurgitation is moderate to severe. Aortic valve sclerosis is present, with no evidence of aortic valve stenosis. Aortic regurgitation PHT measures 311 msec.  5. The inferior vena cava is normal in size with greater than 50% respiratory variability, suggesting right atrial pressure of 3 mmHg. Conclusion(s)/Recommendation(s): No intracardiac source of embolism detected on this transthoracic study. Consider a transesophageal echocardiogram to exclude cardiac source of embolism if clinically indicated. FINDINGS  Left Ventricle: Left ventricular ejection fraction, by estimation, is 65 to 70%. The left ventricle has normal function. The left ventricle has no regional wall motion abnormalities. The left ventricular internal cavity size was normal in size. There is  severe left ventricular hypertrophy. Left ventricular diastolic parameters are consistent with Grade I diastolic dysfunction (impaired  relaxation). Elevated left atrial pressure. Right Ventricle: The right ventricular size is normal. No increase in right ventricular wall thickness. Right ventricular systolic function is normal. Left Atrium: Left atrial size was normal in size. Right Atrium: Right atrial size was normal in size. Pericardium: There is no evidence of pericardial effusion. Mitral Valve: The mitral valve is normal in structure. No evidence of mitral valve regurgitation. No evidence of mitral valve stenosis. Tricuspid Valve: The tricuspid valve is normal in structure. Tricuspid valve regurgitation is not demonstrated. No evidence of tricuspid stenosis. Aortic Valve: The aortic valve is calcified. There is mild calcification of the aortic valve. There is mild thickening of the aortic valve. Aortic valve regurgitation is moderate to severe. Aortic regurgitation PHT measures 311 msec. Aortic valve sclerosis is present, with no evidence of aortic valve stenosis. Aortic valve mean gradient measures 6.0 mmHg. Aortic valve peak gradient measures 11.3 mmHg. Aortic valve area, by VTI measures 2.91 cm. Pulmonic Valve: The pulmonic valve was normal in structure. Pulmonic valve regurgitation is not visualized. No evidence of pulmonic stenosis. Aorta: The aortic root is  normal in size and structure. Venous: The inferior vena cava is normal in size with greater than 50% respiratory variability, suggesting right atrial pressure of 3 mmHg. IAS/Shunts: No atrial level shunt detected by color flow Doppler.  LEFT VENTRICLE PLAX 2D LVIDd:         5.30 cm   Diastology LVIDs:         3.30 cm   LV e' medial:    3.48 cm/s LV PW:         1.70 cm   LV E/e' medial:  19.5 LV IVS:        1.40 cm   LV e' lateral:   4.68 cm/s LVOT diam:     2.20 cm   LV E/e' lateral: 14.5 LV SV:         76 LV SV Index:   38 LVOT Area:     3.80 cm  RIGHT VENTRICLE RV Basal diam:  2.20 cm RV S prime:     15.00 cm/s TAPSE (M-mode): 2.3 cm LEFT ATRIUM             Index        RIGHT  ATRIUM           Index LA diam:        2.20 cm 1.11 cm/m   RA Area:     12.80 cm LA Vol (A2C):   33.3 ml 16.75 ml/m  RA Volume:   28.50 ml  14.34 ml/m LA Vol (A4C):   25.8 ml 12.98 ml/m LA Biplane Vol: 29.4 ml 14.79 ml/m  AORTIC VALVE AV Area (Vmax):    3.24 cm AV Area (Vmean):   3.33 cm AV Area (VTI):     2.91 cm AV Vmax:           168.00 cm/s AV Vmean:          113.000 cm/s AV VTI:            0.261 m AV Peak Grad:      11.3 mmHg AV Mean Grad:      6.0 mmHg LVOT Vmax:         143.00 cm/s LVOT Vmean:        99.000 cm/s LVOT VTI:          0.200 m LVOT/AV VTI ratio: 0.77 AI PHT:            311 msec  AORTA Ao Root diam: 4.10 cm Ao Asc diam:  3.80 cm MITRAL VALVE MV Area (PHT): 3.85 cm    SHUNTS MV Decel Time: 197 msec    Systemic VTI:  0.20 m MV E velocity: 67.70 cm/s  Systemic Diam: 2.20 cm MV A velocity: 81.40 cm/s MV E/A ratio:  0.83 Donato Schultz MD Electronically signed by Donato Schultz MD Signature Date/Time: 04/18/2022/10:24:58 AM    Final    EEG adult  Result Date: 04/18/2022 Charlsie Quest, MD     04/18/2022  9:07 AM Patient Name: Valdis Obenshain MRN: [id number] Epilepsy Attending: Charlsie Quest Referring Physician/Provider: Marjorie Smolder, NP Date: 04/17/2022 Duration: 21.09 mins Patient history: 56 year old patient presents with right hemiplegia, left gaze preference and global aphasia. EEG to evaluate for seizure Level of alertness: Awake, asleep AEDs during EEG study: None Technical aspects: This EEG study was done with scalp electrodes positioned according to the 10-20 International system of electrode placement. Electrical activity was acquired at a sampling rate of 500Hz  and reviewed with a high  frequency filter of 70Hz  and a low frequency filter of 1Hz . EEG data were recorded continuously and digitally stored. Description: The posterior dominant rhythm consists of 8 Hz activity of moderate voltage (25-35 uV) seen predominantly in posterior head regions, symmetric and reactive to eye  opening and eye closing. Sleep was characterized by vertex waves, sleep spindles (12 to 14 Hz), maximal frontocentral region. EEG showed intermittent generalized 3 to 6 Hz theta-delta slowing. Hyperventilation and photic stimulation were not performed.   ABNORMALITY - Intermittent slow, generalized IMPRESSION: This study is suggestive of mild diffuse encephalopathy, nonspecific etiology. No seizures or epileptiform discharges were seen throughout the recording. Priyanka Annabelle Harman        Scheduled Meds:  amLODipine  10 mg Per Tube Daily   aspirin  81 mg Per Tube Daily   clopidogrel  75 mg Per Tube Daily   feeding supplement (PROSource TF)  45 mL Per Tube Daily   heparin  5,000 Units Subcutaneous Q8H   insulin aspart  0-6 Units Subcutaneous Q4H   metoprolol tartrate  25 mg Per Tube BID   multivitamin with minerals  1 tablet Per Tube Daily   mouth rinse  15 mL Mouth Rinse 4 times per day   rosuvastatin  40 mg Per Tube q1800   Continuous Infusions:  sodium chloride 50 mL/hr at 04/19/22 1737   ceFEPime (MAXIPIME) IV 2 g (04/19/22 2156)   feeding supplement (JEVITY 1.2 CAL) 1,000 mL (04/20/22 0318)   metronidazole 500 mg (04/20/22 0347)   vancomycin 1,250 mg (04/19/22 2258)     LOS: 3 days   Time spent:  Azucena Fallen, DO [facility]  If 7PM-7AM, please contact night-coverage [email or web address]  04/20/2022, 7:37 AM

## 2022-04-21 ENCOUNTER — Inpatient Hospital Stay (HOSPITAL_COMMUNITY): Payer: BC Managed Care – PPO

## 2022-04-21 LAB — CBC WITH DIFFERENTIAL/PLATELET
Abs Immature Granulocytes: 0.07 10*3/uL (ref 0.00–0.07)
Basophils Absolute: 0 10*3/uL (ref 0.0–0.1)
Basophils Relative: 0 %
Eosinophils Absolute: 0 10*3/uL (ref 0.0–0.5)
Eosinophils Relative: 0 %
HCT: 52.1 % — ABNORMAL HIGH (ref 39.0–52.0)
Hemoglobin: 16.6 g/dL (ref 13.0–17.0)
Immature Granulocytes: 1 %
Lymphocytes Relative: 23 %
Lymphs Abs: 2.3 10*3/uL (ref 0.7–4.0)
MCH: 29.3 pg (ref 26.0–34.0)
MCHC: 31.9 g/dL (ref 30.0–36.0)
MCV: 92 fL (ref 80.0–100.0)
Monocytes Absolute: 1 10*3/uL (ref 0.1–1.0)
Monocytes Relative: 9 %
Neutro Abs: 6.8 10*3/uL (ref 1.7–7.7)
Neutrophils Relative %: 67 %
Platelets: 248 10*3/uL (ref 150–400)
RBC: 5.66 MIL/uL (ref 4.22–5.81)
RDW: 13.9 % (ref 11.5–15.5)
WBC: 10.2 10*3/uL (ref 4.0–10.5)
nRBC: 0 % (ref 0.0–0.2)

## 2022-04-21 LAB — GLUCOSE, CAPILLARY
Glucose-Capillary: 138 mg/dL — ABNORMAL HIGH (ref 70–99)
Glucose-Capillary: 139 mg/dL — ABNORMAL HIGH (ref 70–99)
Glucose-Capillary: 145 mg/dL — ABNORMAL HIGH (ref 70–99)
Glucose-Capillary: 159 mg/dL — ABNORMAL HIGH (ref 70–99)
Glucose-Capillary: 164 mg/dL — ABNORMAL HIGH (ref 70–99)
Glucose-Capillary: 177 mg/dL — ABNORMAL HIGH (ref 70–99)
Glucose-Capillary: 210 mg/dL — ABNORMAL HIGH (ref 70–99)

## 2022-04-21 LAB — MAGNESIUM
Magnesium: 2.9 mg/dL — ABNORMAL HIGH (ref 1.7–2.4)
Magnesium: 2.6 mg/dL — ABNORMAL HIGH (ref 1.7–2.4)

## 2022-04-21 MED ORDER — CARVEDILOL 12.5 MG PO TABS
25.0000 mg | ORAL_TABLET | Freq: Two times a day (BID) | ORAL | Status: DC
  Administered 2022-04-21 – 2022-04-27 (×14): 25 mg via ORAL
  Filled 2022-04-21 (×14): qty 2

## 2022-04-21 MED ORDER — IRBESARTAN 150 MG PO TABS
75.0000 mg | ORAL_TABLET | Freq: Every day | ORAL | Status: DC
  Administered 2022-04-21 – 2022-04-24 (×4): 75 mg via ORAL
  Filled 2022-04-21 (×4): qty 1

## 2022-04-21 NOTE — Plan of Care (Signed)
  Problem: Education: Goal: Individualized Educational Video(s) Outcome: Progressing   Problem: Coping: Goal: Ability to adjust to condition or change in health will improve Outcome: Progressing   Problem: Fluid Volume: Goal: Ability to maintain a balanced intake and output will improve Outcome: Progressing   Problem: Health Behavior/Discharge Planning: Goal: Ability to identify and utilize available resources and services will improve Outcome: Progressing Goal: Ability to manage health-related needs will improve Outcome: Progressing   Problem: Metabolic: Goal: Ability to maintain appropriate glucose levels will improve Outcome: Progressing   Problem: Nutritional: Goal: Maintenance of adequate nutrition will improve Outcome: Progressing Goal: Progress toward achieving an optimal weight will improve Outcome: Progressing   Problem: Skin Integrity: Goal: Risk for impaired skin integrity will decrease Outcome: Progressing   Problem: Tissue Perfusion: Goal: Adequacy of tissue perfusion will improve Outcome: Progressing   Problem: Education: Goal: Knowledge of General Education information will improve Description: Including pain rating scale, medication(s)/side effects and non-pharmacologic comfort measures Outcome: Progressing   Problem: Health Behavior/Discharge Planning: Goal: Ability to manage health-related needs will improve Outcome: Progressing   Problem: Clinical Measurements: Goal: Ability to maintain clinical measurements within normal limits will improve Outcome: Progressing Goal: Will remain free from infection Outcome: Progressing Goal: Diagnostic test results will improve Outcome: Progressing Goal: Respiratory complications will improve Outcome: Progressing Goal: Cardiovascular complication will be avoided Outcome: Progressing   Problem: Activity: Goal: Risk for activity intolerance will decrease Outcome: Progressing   Problem: Nutrition: Goal:  Adequate nutrition will be maintained Outcome: Progressing   Problem: Coping: Goal: Level of anxiety will decrease Outcome: Progressing   Problem: Elimination: Goal: Will not experience complications related to bowel motility Outcome: Progressing Goal: Will not experience complications related to urinary retention Outcome: Progressing   Problem: Pain Managment: Goal: General experience of comfort will improve Outcome: Progressing   Problem: Safety: Goal: Ability to remain free from injury will improve Outcome: Progressing   Problem: Skin Integrity: Goal: Risk for impaired skin integrity will decrease Outcome: Progressing   Problem: Education: Goal: Knowledge of disease or condition will improve Outcome: Progressing Goal: Knowledge of secondary prevention will improve (SELECT ALL) Outcome: Progressing Goal: Knowledge of patient specific risk factors will improve (INDIVIDUALIZE FOR PATIENT) Outcome: Progressing   Problem: Coping: Goal: Will identify appropriate support needs Outcome: Progressing   Problem: Health Behavior/Discharge Planning: Goal: Ability to manage health-related needs will improve Outcome: Progressing   Problem: Self-Care: Goal: Ability to participate in self-care as condition permits will improve Outcome: Progressing Goal: Ability to communicate needs accurately will improve Outcome: Progressing   Problem: Nutrition: Goal: Risk of aspiration will decrease Outcome: Progressing Goal: Dietary intake will improve Outcome: Progressing

## 2022-04-21 NOTE — Progress Notes (Signed)
PROGRESS NOTE    Alex Grant  ZOX:[id number] DOB: [birth year] DOA: 04/17/2022 PCP: Patient, No Pcp Per   Brief Narrative:  Mr. Aragones is a 56 yo male with PMH HTN, HLD, prediabetes who presented via EMS after being found down at home.  Patient was unable to provide any information due to unresponsiveness on admission and all collateral information was obtained from family.  It appears he was last communicated with on Thursday via phone conversation with his dad.  When EMS found him, he was still in bed when found during welfare check on Saturday.  CT head showed acute infarct involving left basal ganglia, no hemorrhage.  MRI brain also performed after admission which also confirmed acute infarct involving left basal ganglia and corona radiata.    Assessment & Plan:   Principal Problem:   Acute ischemic stroke (HCC) Active Problems:   SIRS (systemic inflammatory response syndrome) (HCC)   Dysphagia   AKI (acute kidney injury) (HCC)   Rhabdomyolysis   Metabolic acidosis   Elevated LFTs   Hypertension   Prolonged QT interval   Prediabetes   Right thyroid nodule   HLD (hyperlipidemia)  Acute ischemic stroke (HCC) - CT head and MRI brain revealed left basal ganglia and corona radiata stroke - Patient has dense right-sided hemiparesis and appears to have right-sided neglect and mutism -can communicates her left hand thumbs up thumbs down and writing - continue DAPT per neuro (asa for 3 weeks then continue on Plavix only) - Cortrak placement on 6/26 -we will need to discuss definitive enteral nutrition if speech evaluation continues to be poor - statin on hold until LFTs improve further  -Tentative plan for 30 day monitor at discharge per neuro rec's - If no significant improvement in the next few days or any further decline, low threshold for consulting palliative care. Family at this time still wishing to remain semi aggressive and is hopeful for some recovery   Dysphagia - Dysphagia  improving somewhat -advance diet slowly as tolerated per speech, unclear if patient will be able to support himself nutritionally with limited diet, continue core track, may still require PEG tube placement if appropriate in the future   Hypertensive emergency - Permissive hypertension for now in setting of large stroke.  Will bring down slowly over next few days -Goal systolic blood pressure 130-140 in the next 24 to 48 hours  SIRS (systemic inflammatory response syndrome) (HCC) -Procalcitonin negative, likely reactive in the setting of large stroke as above as well as profound rhabdomyolysis -Single fever recorded at intake, mild and resolved without treatment -Discontinue all antibiotics, follow clinically -Imaging and work-up thus far remain negative -Pneumonia/aspiration is most likely cause of infection at this time however chest x-ray and symptoms as well as clinical exam are unremarkable for overt pneumonia   Rhabdomyolysis - presumed from prolonged downtime; family reports EMS found him in bed - CK 3,583 on admission - continue trending CK (3583 >> 3327>>2784>>1540) - continue IVF/free water flushes and tube feeds   AKI (acute kidney injury) (HCC) - baseline creatinine ~ 1 - Likely secondary to profound rhabdo vs ATN from prolonged pre-renal (still at risk) -Renal function downtrending appropriately   HLD (hyperlipidemia) - CHL 317, TG 345, HDL 31, LDL 217 - Will eventually need statin but on hold for now in setting of elevated LFTs   Right thyroid nodule - Incidentally noted on CT on admission - 2.2 cm right thyroid nodule - Outpatient thyroid ultrasound and further work-up   Prediabetes -  A1c 5.9% - Diet control once able to initiate tube feeds   Prolonged QT interval - QTtc 519 on EKG - monitor K and Mg     Elevated LFTs - elevated AST/ALT. No signs of obstructive pattern - suspected from acute illness/rhabdo -Hepatitis panel negative - hold off on statin for  CVA until improvement - continue trending    Metabolic acidosis - See AKI and rhabdomyolysis - Continue fluids - Trend BMP  DVT prophylaxis: Heparin Code Status: Full Family Communication: None present -called both father and stepmother with no answer  Status is: Inpatient  Dispo: The patient is from: Home              Anticipated d/c is to: To be determined              Anticipated d/c date is: 72+ hours              Patient currently not medically stable for discharge  Consultants:  Neurology  Procedures:  None  Antimicrobials:  Discontinued 627  Subjective: No acute issues or events overnight, continue to discuss ongoing therapy with patient, review of systems limited given patient's verbal status.  Objective: Vitals:   04/20/22 2210 04/20/22 2340 04/21/22 0321 04/21/22 0433  BP: (!) 186/67 (!) 173/89 (!) 195/79   Pulse: 94 88 96   Resp:  (!) 22 19   Temp:  98.5 F (36.9 C) 99.1 F (37.3 C)   TempSrc:  Oral Oral   SpO2:  92% 96%   Weight:    85.5 kg  Height:        Intake/Output Summary (Last 24 hours) at 04/21/2022 0752 Last data filed at 04/21/2022 0600 Gross per 24 hour  Intake 4811.91 ml  Output 1450 ml  Net 3361.91 ml    Filed Weights   04/17/22 1700 04/21/22 0433  Weight: 85 kg 85.5 kg    Examination:  General:  Pleasantly resting in bed, No acute distress. HEENT: Expressive aphasia without airway compromise, core track NG tube in place Lungs:  Clear to auscultate bilaterally without rhonchi, wheeze, or rales. Heart:  Regular rate and rhythm.  Without murmurs, rubs, or gallops. Abdomen:  Soft, nontender, nondistended.  Without guarding or rebound. Extremities: Profound right-sided weakness 1 out of 5 strength globally Vascular:  Dorsalis pedis and posterior tibial pulses palpable bilaterally. Skin:  Warm and dry, no erythema, no ulcerations.  Data Reviewed: I have personally reviewed following labs and imaging studies  CBC: Recent Labs   Lab 04/17/22 1716 04/17/22 1722 04/18/22 0354 04/19/22 0021 04/20/22 0747 04/21/22 0436  WBC 27.1*  --  27.6* 18.7* 12.5* 10.2  NEUTROABS 23.5*  --   --  14.9* 9.1* 6.8  HGB 20.4* 20.7* 17.7* 17.0 15.9 16.6  HCT 61.1* 61.0* 54.9* 52.9* 50.3 52.1*  MCV 88.2  --  90.1 91.0 90.8 92.0  PLT 493*  --  387 338 284 248    Basic Metabolic Panel: Recent Labs  Lab 04/17/22 1716 04/17/22 1722 04/18/22 0354 04/19/22 0021 04/19/22 1657 04/20/22 0747 04/20/22 1817 04/21/22 0436  NA 144 145 146* 149*  --  154*  --   --   K 4.3 4.2 4.0 3.8  --  3.5  --   --   CL 110 115* 115* 117*  --  120*  --   --   CO2 17*  --  19* 21*  --  23  --   --   GLUCOSE 172* 171* 141* 130*  --  160*  --   --   BUN 61* 61* 68* 44*  --  29*  --   --   CREATININE 3.39* 3.50* 2.95* 1.73*  --  1.39*  --   --   CALCIUM 9.9  --  8.8* 8.3*  --  8.7*  --   --   MG 3.1*  --   --  3.1*  --  3.0* 2.9* 2.9*  PHOS  --   --   --  4.5 4.1 3.0 2.5  --     GFR: Estimated Creatinine Clearance: 62.4 mL/min (A) (by C-G formula based on SCr of 1.39 mg/dL (H)). Liver Function Tests: Recent Labs  Lab 04/17/22 1716 04/18/22 0354 04/19/22 0021 04/20/22 0747  AST 83* 75* 80* 83*  ALT 55* 47* 54* 69*  ALKPHOS 83 66 71 60  BILITOT 0.8 0.9 0.8 0.7  PROT 8.5* 7.0 6.5 6.4*  ALBUMIN 4.0 3.3* 3.2* 3.1*    No results for input(s): "LIPASE", "AMYLASE" in the last 168 hours. No results for input(s): "AMMONIA" in the last 168 hours. Coagulation Profile: Recent Labs  Lab 04/17/22 1716 04/17/22 1830  INR SPECIMEN/CONTAINER TYPE INAPPROPRIATE FOR ORDERED TEST, UNABLE TO PERFORM 1.2    Cardiac Enzymes: Recent Labs  Lab 04/17/22 1716 04/18/22 0335 04/19/22 0021 04/20/22 0747  CKTOTAL 3,583* 3,327* 2,784* 1,540*    BNP (last 3 results) No results for input(s): "PROBNP" in the last 8760 hours. HbA1C: No results for input(s): "HGBA1C" in the last 72 hours.  CBG: Recent Labs  Lab 04/20/22 1139 04/20/22 1529  04/20/22 1927 04/20/22 2341 04/21/22 0322  GLUCAP 160* 173* 139* 163* 139*    Lipid Profile: No results for input(s): "CHOL", "HDL", "LDLCALC", "TRIG", "CHOLHDL", "LDLDIRECT" in the last 72 hours.  Thyroid Function Tests: No results for input(s): "TSH", "T4TOTAL", "FREET4", "T3FREE", "THYROIDAB" in the last 72 hours. Anemia Panel: No results for input(s): "VITAMINB12", "FOLATE", "FERRITIN", "TIBC", "IRON", "RETICCTPCT" in the last 72 hours. Sepsis Labs: Recent Labs  Lab 04/18/22 0335 04/18/22 0354 04/19/22 0021 04/20/22 0747  PROCALCITON 0.14  --  <0.10 <0.10  LATICACIDVEN  --  1.3  --   --      Recent Results (from the past 240 hour(s))  SARS Coronavirus 2 by RT PCR (hospital order, performed in [facility] hospital lab) *cepheid single result test* Anterior Nasal Swab     Status: None   Collection Time: 04/17/22  9:20 PM   Specimen: Anterior Nasal Swab  Result Value Ref Range Status   SARS Coronavirus 2 by RT PCR NEGATIVE NEGATIVE Final    Comment: (NOTE) SARS-CoV-2 target nucleic acids are NOT DETECTED.  The SARS-CoV-2 RNA is generally detectable in upper and lower respiratory specimens during the acute phase of infection. The lowest concentration of SARS-CoV-2 viral copies this assay can detect is 250 copies / mL. A negative result does not preclude SARS-CoV-2 infection and should not be used as the sole basis for treatment or other patient management decisions.  A negative result may occur with improper specimen collection / handling, submission of specimen other than nasopharyngeal swab, presence of viral mutation(s) within the areas targeted by this assay, and inadequate number of viral copies (<250 copies / mL). A negative result must be combined with clinical observations, patient history, and epidemiological information.  Fact Sheet for Patients:   RoadLapTop.co.za  Fact Sheet for Healthcare  Providers: http://kim-miller.com/  This test is not yet approved or  cleared by the [place] FDA and has  been authorized for detection and/or diagnosis of SARS-CoV-2 by FDA under an Emergency Use Authorization (EUA).  This EUA will remain in effect (meaning this test can be used) for the duration of the COVID-19 declaration under Section 564(b)(1) of the Act, 21 U.S.C. section 360bbb-3(b)(1), unless the authorization is terminated or revoked sooner.  Performed at [facility], [address] [place], [place] [address]   Urine Culture     Status: None   Collection Time: 04/18/22  2:47 AM   Specimen: In/Out Cath Urine  Result Value Ref Range Status   Specimen Description IN/OUT CATH URINE  Final   Special Requests NONE  Final   Culture   Final    NO GROWTH Performed at [facility], [address] [place], [place] [address]    Report Status 04/19/2022 FINAL  Final  Culture, blood (Routine X 2) w Reflex to ID Panel     Status: None (Preliminary result)   Collection Time: 04/18/22  3:54 AM   Specimen: BLOOD RIGHT HAND  Result Value Ref Range Status   Specimen Description BLOOD RIGHT HAND  Final   Special Requests   Final    BOTTLES DRAWN AEROBIC AND ANAEROBIC Blood Culture adequate volume   Culture   Final    NO GROWTH 3 DAYS Performed at [facility], [address], [place], [place] [address]    Report Status PENDING  Incomplete  Culture, blood (Routine X 2) w Reflex to ID Panel     Status: None (Preliminary result)   Collection Time: 04/18/22  3:54 AM   Specimen: BLOOD  Result Value Ref Range Status   Specimen Description BLOOD RIGHT ANTECUBITAL  Final   Special Requests   Final    BOTTLES DRAWN AEROBIC AND ANAEROBIC Blood Culture adequate volume   Culture   Final    NO GROWTH 3 DAYS Performed at [facility], [address] [place], [place] [address]    Report Status PENDING  Incomplete         Radiology  Studies: DG Abd Portable 1V  Result Date: 04/19/2022 CLINICAL DATA:  Feeding tube placement. EXAM: PORTABLE ABDOMEN - 1 VIEW COMPARISON:  None Available. FINDINGS: Tip of a small bore feeding tube is in the distal stomach or more likely duodenal bulb. Lung bases are clear.  Bowel gas pattern is otherwise unremarkable. IMPRESSION: Tip of small bore feeding tube in the distal stomach or duodenal bulb. Electronically Signed   By: Marin Roberts M.D.   On: 04/19/2022 14:10   DG CHEST PORT 1 VIEW  Result Date: 04/19/2022 CLINICAL DATA:  Chest pain EXAM: PORTABLE CHEST 1 VIEW COMPARISON:  04/17/2022 FINDINGS: No focal consolidation. No pleural effusion or pneumothorax. Heart and mediastinal contours are unremarkable. No acute osseous abnormality. IMPRESSION: No active disease. Electronically Signed   By: Elige Ko M.D.   On: 04/19/2022 13:19        Scheduled Meds:  amLODipine  10 mg Per Tube Daily   aspirin  81 mg Per Tube Daily   carvedilol  25 mg Oral BID WC   clopidogrel  75 mg Per Tube Daily   feeding supplement (PROSource TF)  45 mL Per Tube Daily   heparin  5,000 Units Subcutaneous Q8H   insulin aspart  0-6 Units Subcutaneous Q4H   irbesartan  75 mg Oral Daily   multivitamin with minerals  1 tablet Per Tube Daily   mouth rinse  15 mL Mouth  Rinse 4 times per day   rosuvastatin  40 mg Per Tube q1800   Continuous Infusions:  feeding supplement (JEVITY 1.2 CAL) 1,000 mL (04/21/22 0317)     LOS: 4 days   Time spent:  Azucena Fallen, DO [facility]  If 7PM-7AM, please contact night-coverage [email or web address]  04/21/2022, 7:52 AM

## 2022-04-21 NOTE — Progress Notes (Signed)
Modified Barium Swallow Progress Note  Patient Details  Name: Alex Grant MRN: [id number] Date of Birth: 1966-09-19  Today's Date: 04/21/2022  Modified Barium Swallow completed.  Full report located under Chart Review in the Imaging Section.  Brief recommendations include the following:  Clinical Impression  Patient presents with a mod-severe oral dysphagia and a mild-moderate pharyngeal phase dysphagia as per this MBS. During oral phase, he exhibited very limited ability to masticate and orally transit soft solids and exhibited delayed anterior to posterior transit and oral residuals with all consistencies. He exhibited anterior spillage with thin, nectar and honey thick liquids. During pharyngeal phase, patient exhibited swallow initiation delays to level of vallecular sinus with puree, soft solids and honey thick liquids and delay to pyriform sinus with nectar thick and thin liquids. Delayed pharyngeal transit observed with puree, soft solid, honey thick liquids. Flash penetration occured with nectar thick and thin liquids with penetrate staying above vocal cords and fully exiting laryngeal vestibule. One instance of silent aspiration occured when patient took sip of thin liquid barium while he still had some puree solids in oral cavity. Mild vallecular residuals observed with soft solids bolus but cleared with subsequent swallows and no significant amount of pharyngeal residuals observed at end of study. No penetration or aspiration occurred with honey thick liquids. SLP is recommending initiate PO diet of Dys 1 (puree)solids and honey thick liquids.   Swallow Evaluation Recommendations       SLP Diet Recommendations: Honey thick liquids;Dysphagia 1 (Puree) solids   Liquid Administration via: Cup   Medication Administration: Crushed with puree   Supervision: Staff to assist with self feeding;Full supervision/cueing for compensatory strategies   Compensations: Small sips/bites;Slow  rate;Minimize environmental distractions;Monitor for anterior loss   Postural Changes: Seated upright at 90 degrees   Oral Care Recommendations: Oral care BID;Staff/trained caregiver to provide oral care;Oral care before and after PO   Other Recommendations: Order thickener from pharmacy;Prohibited food (jello, ice cream, thin soups);Clarify dietary restrictions;Have oral suction available   Angela Nevin, MA, CCC-SLP Speech Therapy

## 2022-04-21 NOTE — Progress Notes (Signed)
Physical Therapy Treatment  Patient Details Name: Alex Grant MRN: [id number] DOB: 1966-01-16 Today's Date: 04/21/2022   History of Present Illness Pt is a 56 y.o. male who presented 04/17/22 after being found down at home with R sided hemiplegia, L gaze deviation, and aphasia. 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 PMH: HTN, HLD, prediabetes    PT Comments    Pt progressing towards physical therapy goals. Pt demonstrated improved ability to follow commands, perform sitting balance, and tolerated increased standing time in [place]. Noted [facility] denied pt and plan is now for SNF level rehab. Will continue to follow and progress as able per POC.     Recommendations for follow up therapy are one component of a multi-disciplinary discharge planning process, led by the attending physician.  Recommendations may be updated based on patient status, additional functional criteria and insurance authorization.  Follow Up Recommendations  Skilled nursing-short term rehab (<3 hours/day) (Denied by [facility]) Can patient physically be transported by private vehicle: No   Assistance Recommended at Discharge Frequent or constant Supervision/Assistance  Patient can return home with the following Two people to help with walking and/or transfers;Two people to help with bathing/dressing/bathroom;Assistance with cooking/housework;Assistance with feeding;Direct supervision/assist for medications management;Direct supervision/assist for financial management;Assist for transportation;Help with stairs or ramp for entrance   Equipment Recommendations  Rolling walker (2 wheels);BSC/3in1;Wheelchair (measurements PT);Wheelchair cushion (measurements PT);Hospital bed;Other (comment) (lift equipment, pending progress)    Recommendations for Other Services Rehab consult     Precautions / Restrictions Precautions Precautions: Fall Precaution Comments: mild pusher; watch  O2 Restrictions Weight Bearing Restrictions: No     Mobility  Bed Mobility Overal bed mobility: Needs Assistance Bed Mobility: Rolling, Sidelying to Sit Rolling: +2 for physical assistance, +2 for safety/equipment, Max assist Sidelying to sit: Max assist, +2 for physical assistance, +2 for safety/equipment Supine to sit: Max assist, +2 for physical assistance, HOB elevated Sit to supine: Total assist, +2 for physical assistance, +2 for safety/equipment   General bed mobility comments: mod A +2 with cues for rolling, initation of LLE but modA for RLE maneuvering. pt unable to elevate trunk despite pulling hard on therapist. bed pad utilized to scoot to EOB.    Transfers Overall transfer level: Needs assistance Equipment used: Ambulation equipment used Transfers: Sit to/from Stand Sit to Stand: Mod assist, +2 physical assistance, +2 safety/equipment           General transfer comment: from stedy against chair x5 and EOB x1. Stedy utilized for [employer] transfer. Pt motivated to initiate sit>stand, and with good pull from center bar of Stedy with the LUE. initial R lateral lean but able to make some postural corrections with verbal and tactile cues. required vc's for RUE grip on center bar of Stedy. Transfer via Lift Equipment: Stedy  Ambulation/Gait                   Stairs             Wheelchair Mobility    Modified Rankin (Stroke Patients Only) Modified Rankin (Stroke Patients Only) Pre-Morbid Rankin Score: No symptoms Modified Rankin: Severe disability     Balance Overall balance assessment: Needs assistance Sitting-balance support: Single extremity supported, No upper extremity supported, Feet supported Sitting balance-Leahy Scale: Poor Sitting balance - Comments: mod-max A required for sitting balance, heavy R lateral lean with cues to correct. pt able to maintain sitting balance without therapist support ~5-10" at a time. Postural control: Right  lateral lean Standing balance support: Bilateral upper extremity supported Standing balance-Leahy Scale: Zero Standing balance comment: +2 assist required, min A when in stedy for forward lean and R lateral lean.                            Cognition Arousal/Alertness: Awake/alert Behavior During Therapy: Flat affect, Impulsive Overall Cognitive Status: Impaired/Different from baseline Area of Impairment: Following commands, Safety/judgement, Awareness, Problem solving, Attention                   Current Attention Level: Focused   Following Commands: Follows one step commands consistently, Follows one step commands with increased time, Follows multi-step commands inconsistently Safety/Judgement: Decreased awareness of safety, Decreased awareness of deficits Awareness: Intellectual Problem Solving: Slow processing, Decreased initiation, Difficulty sequencing, Requires verbal cues, Requires tactile cues General Comments: Pt answering questions with thumbs up for yes and thumbs down for no with increased time. Able to write on a note pad.        Exercises Other Exercises Other Exercises: 5x L Lateral weight shift Other Exercises: 5x STS Other Exercises: Seated in Stedy at the sink to wash face. Maintained sitting balance ~ 5 minutes total.    General Comments        Pertinent Vitals/Pain Pain Assessment Pain Assessment: Faces Faces Pain Scale: No hurt Pain Intervention(s): Monitored during session    Home Living                          Prior Function            PT Goals (current goals can now be found in the care plan section) Acute Rehab PT Goals Patient Stated Goal: did not state PT Goal Formulation: Patient unable to participate in goal setting Time For Goal Achievement: 05/02/22 Potential to Achieve Goals: Fair Progress towards PT goals: Progressing toward goals    Frequency    Min 4X/week      PT Plan Current plan remains  appropriate    Co-evaluation              AM-PAC PT "6 Clicks" Mobility   Outcome Measure  Help needed turning from your back to your side while in a flat bed without using bedrails?: A Lot Help needed moving from lying on your back to sitting on the side of a flat bed without using bedrails?: A Lot Help needed moving to and from a bed to a chair (including a wheelchair)?: Total Help needed standing up from a chair using your arms (e.g., wheelchair or bedside chair)?: Total Help needed to walk in hospital room?: Total Help needed climbing 3-5 steps with a railing? : Total 6 Click Score: 8    End of Session Equipment Utilized During Treatment: Gait belt Activity Tolerance: Patient tolerated treatment well Patient left: in chair;with call bell/phone within reach;with chair alarm set;with family/visitor present Nurse Communication: Mobility status;Need for lift equipment [facility] for back to bed) PT Visit Diagnosis: Unsteadiness on feet (R26.81);Muscle weakness (generalized) (M62.81);Difficulty in walking, not elsewhere classified (R26.2);Other symptoms and signs involving the nervous system (R29.898)     Time: 9518-8416 PT Time Calculation (min) (ACUTE ONLY): 59 min  Charges:  $Gait Training: 8-22 mins $Therapeutic Exercise: 8-22 mins $Therapeutic Activity: 8-22 mins $Neuromuscular Re-education: 8-22 mins                     Alex Grant,  PT, DPT Acute Rehabilitation Services Secure Chat Preferred Office: [phone]    Alex Grant 04/21/2022, 3:22 PM

## 2022-04-22 LAB — GLUCOSE, CAPILLARY
Glucose-Capillary: 153 mg/dL — ABNORMAL HIGH (ref 70–99)
Glucose-Capillary: 161 mg/dL — ABNORMAL HIGH (ref 70–99)
Glucose-Capillary: 155 mg/dL — ABNORMAL HIGH (ref 70–99)
Glucose-Capillary: 210 mg/dL — ABNORMAL HIGH (ref 70–99)
Glucose-Capillary: 157 mg/dL — ABNORMAL HIGH (ref 70–99)

## 2022-04-22 LAB — CBC WITH DIFFERENTIAL/PLATELET
Abs Immature Granulocytes: 0.1 10*3/uL — ABNORMAL HIGH (ref 0.00–0.07)
Basophils Absolute: 0 10*3/uL (ref 0.0–0.1)
Basophils Relative: 0 %
Eosinophils Absolute: 0.3 10*3/uL (ref 0.0–0.5)
Eosinophils Relative: 3 %
HCT: 49.6 % (ref 39.0–52.0)
Hemoglobin: 15.8 g/dL (ref 13.0–17.0)
Immature Granulocytes: 1 %
Lymphocytes Relative: 23 %
Lymphs Abs: 3 10*3/uL (ref 0.7–4.0)
MCH: 29.4 pg (ref 26.0–34.0)
MCHC: 31.9 g/dL (ref 30.0–36.0)
MCV: 92.4 fL (ref 80.0–100.0)
Monocytes Absolute: 1 10*3/uL (ref 0.1–1.0)
Monocytes Relative: 7 %
Neutro Abs: 8.7 10*3/uL — ABNORMAL HIGH (ref 1.7–7.7)
Neutrophils Relative %: 66 %
Platelets: 223 10*3/uL (ref 150–400)
RBC: 5.37 MIL/uL (ref 4.22–5.81)
RDW: 13.7 % (ref 11.5–15.5)
WBC: 13.1 10*3/uL — ABNORMAL HIGH (ref 4.0–10.5)
nRBC: 0 % (ref 0.0–0.2)

## 2022-04-22 LAB — MAGNESIUM: Magnesium: 2.5 mg/dL — ABNORMAL HIGH (ref 1.7–2.4)

## 2022-04-22 NOTE — Progress Notes (Signed)
Occupational Therapy Treatment Patient Details Name: Alex Grant MRN: [id number] DOB: February 04, 1966 Today's Date: 04/22/2022   History of present illness Pt is a 56 y.o. male who presented 04/17/22 after being found down at home with R sided hemiplegia, L gaze deviation, and aphasia. 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 PMH: HTN, HLD, prediabetes   OT comments  Pt progressing towards established OT goals and highly motivated to participate in therapy. Pt performing UB dressing, grooming at sink, and UB bathing with Mod A and Max cues. Challenging sitting balance and activity tolerance while performing grooming tasks seated at sink on stedy. Continue to recommend dc to post-acute rehab and will continue to follow acutely as admitted.    Recommendations for follow up therapy are one component of a multi-disciplinary discharge planning process, led by the attending physician.  Recommendations may be updated based on patient status, additional functional criteria and insurance authorization.    Follow Up Recommendations  Skilled nursing-short term rehab (<3 hours/day)    Assistance Recommended at Discharge Frequent or constant Supervision/Assistance  Patient can return home with the following  Two people to help with walking and/or transfers;Two people to help with bathing/dressing/bathroom;Assistance with cooking/housework;Assistance with feeding;Direct supervision/assist for medications management;Direct supervision/assist for financial management;Assist for transportation;Help with stairs or ramp for entrance   Equipment Recommendations  BSC/3in1;Tub/shower seat;Wheelchair (measurements OT);Wheelchair cushion (measurements OT)    Recommendations for Other Services      Precautions / Restrictions Precautions Precautions: Fall Restrictions Weight Bearing Restrictions: No       Mobility Bed Mobility Overal bed mobility: Needs Assistance Bed  Mobility: Rolling, Sidelying to Sit Rolling: +2 for physical assistance, +2 for safety/equipment, Max assist, Mod assist Sidelying to sit: Mod assist, +2 for physical assistance, +2 for safety/equipment       General bed mobility comments: Mod A +2 for power up into sitting from sidelying. Cues for hand placement. Max A for rolling to L    Transfers Overall transfer level: Needs assistance Equipment used: Ambulation equipment used Transfers: Sit to/from Stand Sit to Stand: Mod assist, +2 physical assistance, +2 safety/equipment, Min assist           General transfer comment: Mod A for power up from recliner and Min A for power up from stedy flats; +2 overall Transfer via Lift Equipment: Stedy   Balance Overall balance assessment: Needs assistance Sitting-balance support: Single extremity supported, No upper extremity supported, Feet supported Sitting balance-Leahy Scale: Poor Sitting balance - Comments: Min Guard A - Min A for sitting balance Postural control: Right lateral lean Standing balance support: Single extremity supported, During functional activity Standing balance-Leahy Scale: Poor                             ADL either performed or assessed with clinical judgement   ADL Overall ADL's : Needs assistance/impaired     Grooming: Wash/dry face;Minimal assistance;Moderate assistance;Oral care;Sitting;Applying deodorant Grooming Details (indicate cue type and reason): Requiring assistance for bilateral coordination to perforl oral care. assistanace for weight bearing through RUE on sink with extra support at elbow. Mod A for applying deotorant to lift RUE and then educate on one handed technique for L axillary area Upper Body Bathing: Moderate assistance;Sitting Upper Body Bathing Details (indicate cue type and reason): Pt washing his axillary area while seated on steyd at sink. Cues for steps. Assistance for lifting RUE     Upper Body  Dressing : Moderate  assistance;Sitting Upper Body Dressing Details (indicate cue type and reason): Assistance to locate RUE and hold R wrist with L hand. Pt threading RUE into sleeve     Toilet Transfer: Moderate assistance;+2 for physical assistance   Toileting- Clothing Manipulation and Hygiene: Maximal assistance;+2 for physical assistance;Sit to/from stand       Functional mobility during ADLs: Moderate assistance;+2 for physical assistance (sit<>stand with stedy) General ADL Comments: Focused session on sitting balance, sustained attention, and acitvity tolerance while performing ADLs at sink    Extremity/Trunk Assessment Upper Extremity Assessment Upper Extremity Assessment: RUE deficits/detail RUE Deficits / Details: Some extensor tone at elbow and wrist, unable to flex shoulder, elbow or wrist. Fingers in extension at this time RUE Coordination: decreased fine motor;decreased gross motor   Lower Extremity Assessment Lower Extremity Assessment: Defer to PT evaluation RLE Deficits / Details: ankle plantarflexion and knee flexion spasticity noted, but able to achieve neutral ankle dorsiflexion and full knee extension PROM with time and stretching; no active movement or muscle activation throughout R leg (0/5 MMT) RLE Coordination: decreased gross motor;decreased fine motor        Vision   Vision Assessment?: Vision impaired- to be further tested in functional context   Perception     Praxis      Cognition Arousal/Alertness: Awake/alert Behavior During Therapy: Flat affect, Impulsive Overall Cognitive Status: Impaired/Different from baseline Area of Impairment: Following commands, Safety/judgement, Awareness, Problem solving, Attention                   Current Attention Level: Sustained   Following Commands: Follows one step commands consistently, Follows one step commands with increased time, Follows multi-step commands inconsistently Safety/Judgement: Decreased awareness of  safety, Decreased awareness of deficits Awareness: Intellectual Problem Solving: Slow processing, Decreased initiation, Difficulty sequencing, Requires verbal cues, Requires tactile cues General Comments: Answering question for yes/no with thumbs up and word board. Following commands and requiring increased time for initating.        Exercises      Shoulder Instructions       General Comments VSS    Pertinent Vitals/ Pain       Pain Assessment Pain Assessment: Faces Faces Pain Scale: No hurt Pain Location: generalized with movement Pain Descriptors / Indicators: Grimacing Pain Intervention(s): Monitored during session, Limited activity within patient's tolerance, Repositioned  Home Living                                          Prior Functioning/Environment              Frequency  Min 2X/week        Progress Toward Goals  OT Goals(current goals can now be found in the care plan section)  Progress towards OT goals: Progressing toward goals  Acute Rehab OT Goals OT Goal Formulation: Patient unable to participate in goal setting Time For Goal Achievement: 05/02/22 Potential to Achieve Goals: Good ADL Goals Pt Will Perform Eating: with min assist;with adaptive utensils;sitting Pt Will Perform Grooming: with min assist;with adaptive equipment;sitting Pt Will Transfer to Toilet: with mod assist;stand pivot transfer Pt/caregiver will Perform Home Exercise Program: Increased ROM;Increased strength;Right Upper extremity;With minimal assist;With written HEP provided Additional ADL Goal #1: Pt will follow 2 step commands 75% of each therapy session. Additional ADL Goal #2: Pt will complete bed mobility with mod A as a  precursor to seated ADL's.  Plan Discharge plan remains appropriate    Co-evaluation    PT/OT/SLP Co-Evaluation/Treatment: Yes Reason for Co-Treatment: To address functional/ADL transfers;For patient/therapist safety   OT goals  addressed during session: ADL's and self-care      AM-PAC OT "6 Clicks" Daily Activity     Outcome Measure   Help from another person eating meals?: Total Help from another person taking care of personal grooming?: A Lot Help from another person toileting, which includes using toliet, bedpan, or urinal?: A Lot Help from another person bathing (including washing, rinsing, drying)?: A Lot Help from another person to put on and taking off regular upper body clothing?: A Lot Help from another person to put on and taking off regular lower body clothing?: Total 6 Click Score: 10    End of Session Equipment Utilized During Treatment: Gait belt  OT Visit Diagnosis: Unsteadiness on feet (R26.81);Other abnormalities of gait and mobility (R26.89);Muscle weakness (generalized) (M62.81);Hemiplegia and hemiparesis Hemiplegia - Right/Left: Right Hemiplegia - dominant/non-dominant: Dominant Hemiplegia - caused by: Cerebral infarction   Activity Tolerance Patient tolerated treatment well   Patient Left with call bell/phone within reach;in chair;with chair alarm set   Nurse Communication Mobility status        Time: 5170-0174 OT Time Calculation (min): 53 min  Charges: OT General Charges $OT Visit: 1 Visit OT Treatments $Self Care/Home Management : 23-37 mins  Marah Park MSOT, OTR/L Acute Rehab Office: [phone]  Theodoro Grist Duval Macleod 04/22/2022, 11:00 AM

## 2022-04-22 NOTE — Progress Notes (Signed)
PROGRESS NOTE    Gautam Langhorst  URK:[id number] DOB: 05-Aug-1966 DOA: 04/17/2022 PCP: Patient, No Pcp Per   Brief Narrative:  Mr. Spelman is a 56 yo male with PMH HTN, HLD, prediabetes who presented via EMS after being found down at home.  Patient was unable to provide any information due to unresponsiveness on admission and all collateral information was obtained from family.  It appears he was last communicated with on Thursday via phone conversation with his dad.  When EMS found him, he was still in bed when found during welfare check on Saturday.  CT head showed acute infarct involving left basal ganglia, no hemorrhage.  MRI brain also performed after admission which also confirmed acute infarct involving left basal ganglia and corona radiata.    Assessment & Plan:   Principal Problem:   Acute ischemic stroke (HCC) Active Problems:   SIRS (systemic inflammatory response syndrome) (HCC)   Dysphagia   AKI (acute kidney injury) (HCC)   Rhabdomyolysis   Metabolic acidosis   Elevated LFTs   Hypertension   Prolonged QT interval   Prediabetes   Right thyroid nodule   HLD (hyperlipidemia)  Acute ischemic stroke (HCC) - CT head and MRI brain revealed left basal ganglia and corona radiata stroke - Patient has dense right-sided hemiparesis and appears to have right-sided neglect and mutism -can communicates her left hand thumbs up thumbs down and writing - continue DAPT per neuro (asa for 3 weeks then continue on Plavix only) - Cortrak placement on 6/26 -we will need to discuss definitive enteral nutrition if speech evaluation continues to be poor - statin on hold until LFTs improve further  -Tentative plan for 30 day monitor at discharge per neuro rec's - If no significant improvement in the next few days or any further decline, low threshold for consulting palliative care. Family at this time still wishing to remain semi aggressive and is hopeful for some recovery   Dysphagia - Dysphagia  improving somewhat -advance diet slowly as tolerated per speech, unclear if patient will be able to support himself nutritionally with limited diet, continue core track, may still require PEG tube placement if appropriate in the future   Hypertensive emergency - Permissive hypertension for now in setting of large stroke.  Will bring down slowly over next few days -Goal systolic blood pressure 130-140 in the next 24 to 48 hours  SIRS (systemic inflammatory response syndrome) (HCC) -Procalcitonin negative, likely reactive in the setting of large stroke as above as well as profound rhabdomyolysis -Single fever recorded at intake, mild and resolved without treatment -Discontinue all antibiotics, follow clinically -Imaging and work-up thus far remain negative -Pneumonia/aspiration is most likely cause of infection at this time however chest x-ray and symptoms as well as clinical exam are unremarkable for overt pneumonia   Rhabdomyolysis, resolving - presumed from prolonged downtime; family reports EMS found him in bed - CK 3,583 on admission - continue trending CK (3583 >> 3327>>2784>>1540) - continue IVF/free water flushes and tube feeds   AKI (acute kidney injury) (HCC), resolved - baseline creatinine ~ 1 - Likely secondary to profound rhabdo vs ATN from prolonged pre-renal (still at risk) - Renal function downtrending appropriately   HLD (hyperlipidemia) - CHL 317, TG 345, HDL 31, LDL 217 - Will eventually need statin but on hold for now in setting of elevated LFTs   Right thyroid nodule - Incidentally noted on CT on admission - 2.2 cm right thyroid nodule - Outpatient thyroid ultrasound and further work-up  Prediabetes - A1c 5.9% - Diet control once able to initiate tube feeds   Prolonged QT interval - QTtc 519 on EKG - monitor K and Mg     Elevated LFTs - elevated AST/ALT. No signs of obstructive pattern - suspected from acute illness/rhabdo -Hepatitis panel negative -  hold off on statin for CVA until improvement - continue trending    Metabolic acidosis - See AKI and rhabdomyolysis - Continue fluids - Trend BMP  DVT prophylaxis: Heparin Code Status: Full Family Communication: None present -called both father and stepmother with no answer  Status is: Inpatient  Dispo: The patient is from: Home              Anticipated d/c is to: To be determined              Anticipated d/c date is: 72+ hours              Patient currently not medically stable for discharge  Consultants:  Neurology  Procedures:  None  Antimicrobials:  Discontinued 627  Subjective: No acute issues or events overnight, continue to discuss ongoing therapy with patient, review of systems limited given patient's verbal status.  Objective: Vitals:   04/21/22 2333 04/22/22 0340 04/22/22 0422 04/22/22 0731  BP: (!) 152/56 (!) 151/76  (P) 116/81  Pulse: 77 68  (P) 69  Resp: 19 15  (P) 19  Temp: 97.9 F (36.6 C) 98.2 F (36.8 C)  (P) 97.6 F (36.4 C)  TempSrc: Oral Oral  (P) Oral  SpO2: 96% 94%  (P) 94%  Weight:   85.3 kg   Height:        Intake/Output Summary (Last 24 hours) at 04/22/2022 0808 Last data filed at 04/22/2022 0427 Gross per 24 hour  Intake 1243.67 ml  Output --  Net 1243.67 ml    Filed Weights   04/17/22 1700 04/21/22 0433 04/22/22 0422  Weight: 85 kg 85.5 kg 85.3 kg    Examination:  General:  Pleasantly resting in bed, No acute distress. HEENT: Expressive aphasia without airway compromise, core track NG tube in place Lungs:  Clear to auscultate bilaterally without rhonchi, wheeze, or rales. Heart:  Regular rate and rhythm.  Without murmurs, rubs, or gallops. Abdomen:  Soft, nontender, nondistended.  Without guarding or rebound. Extremities: Profound right-sided weakness 1 out of 5 strength globally Vascular:  Dorsalis pedis and posterior tibial pulses palpable bilaterally. Skin:  Warm and dry, no erythema, no ulcerations.  Data Reviewed: I  have personally reviewed following labs and imaging studies  CBC: Recent Labs  Lab 04/17/22 1716 04/17/22 1722 04/18/22 0354 04/19/22 0021 04/20/22 0747 04/21/22 0436 04/22/22 0355  WBC 27.1*  --  27.6* 18.7* 12.5* 10.2 13.1*  NEUTROABS 23.5*  --   --  14.9* 9.1* 6.8 8.7*  HGB 20.4*   < > 17.7* 17.0 15.9 16.6 15.8  HCT 61.1*   < > 54.9* 52.9* 50.3 52.1* 49.6  MCV 88.2  --  90.1 91.0 90.8 92.0 92.4  PLT 493*  --  387 338 284 248 223   < > = values in this interval not displayed.    Basic Metabolic Panel: Recent Labs  Lab 04/17/22 1716 04/17/22 1722 04/18/22 0354 04/19/22 0021 04/19/22 1657 04/20/22 0747 04/20/22 1817 04/21/22 0436 04/21/22 1754 04/22/22 0355  NA 144 145 146* 149*  --  154*  --   --   --   --   K 4.3 4.2 4.0 3.8  --  3.5  --   --   --   --  CL 110 115* 115* 117*  --  120*  --   --   --   --   CO2 17*  --  19* 21*  --  23  --   --   --   --   GLUCOSE 172* 171* 141* 130*  --  160*  --   --   --   --   BUN 61* 61* 68* 44*  --  29*  --   --   --   --   CREATININE 3.39* 3.50* 2.95* 1.73*  --  1.39*  --   --   --   --   CALCIUM 9.9  --  8.8* 8.3*  --  8.7*  --   --   --   --   MG 3.1*  --   --  3.1*  --  3.0* 2.9* 2.9* 2.6* 2.5*  PHOS  --   --   --  4.5 4.1 3.0 2.5  --   --   --     GFR: Estimated Creatinine Clearance: 62.4 mL/min (A) (by C-G formula based on SCr of 1.39 mg/dL (H)). Liver Function Tests: Recent Labs  Lab 04/17/22 1716 04/18/22 0354 04/19/22 0021 04/20/22 0747  AST 83* 75* 80* 83*  ALT 55* 47* 54* 69*  ALKPHOS 83 66 71 60  BILITOT 0.8 0.9 0.8 0.7  PROT 8.5* 7.0 6.5 6.4*  ALBUMIN 4.0 3.3* 3.2* 3.1*    No results for input(s): "LIPASE", "AMYLASE" in the last 168 hours. No results for input(s): "AMMONIA" in the last 168 hours. Coagulation Profile: Recent Labs  Lab 04/17/22 1716 04/17/22 1830  INR SPECIMEN/CONTAINER TYPE INAPPROPRIATE FOR ORDERED TEST, UNABLE TO PERFORM 1.2    Cardiac Enzymes: Recent Labs  Lab  04/17/22 1716 04/18/22 0335 04/19/22 0021 04/20/22 0747  CKTOTAL 3,583* 3,327* 2,784* 1,540*    BNP (last 3 results) No results for input(s): "PROBNP" in the last 8760 hours. HbA1C: No results for input(s): "HGBA1C" in the last 72 hours.  CBG: Recent Labs  Lab 04/21/22 1614 04/21/22 2009 04/21/22 2338 04/22/22 0339 04/22/22 0734  GLUCAP 210* 164* 138* 153* 161*    Lipid Profile: No results for input(s): "CHOL", "HDL", "LDLCALC", "TRIG", "CHOLHDL", "LDLDIRECT" in the last 72 hours.  Thyroid Function Tests: No results for input(s): "TSH", "T4TOTAL", "FREET4", "T3FREE", "THYROIDAB" in the last 72 hours. Anemia Panel: No results for input(s): "VITAMINB12", "FOLATE", "FERRITIN", "TIBC", "IRON", "RETICCTPCT" in the last 72 hours. Sepsis Labs: Recent Labs  Lab 04/18/22 0335 04/18/22 0354 04/19/22 0021 04/20/22 0747  PROCALCITON 0.14  --  <0.10 <0.10  LATICACIDVEN  --  1.3  --   --      Recent Results (from the past 240 hour(s))  SARS Coronavirus 2 by RT PCR (hospital order, performed in [facility] hospital lab) *cepheid single result test* Anterior Nasal Swab     Status: None   Collection Time: 04/17/22  9:20 PM   Specimen: Anterior Nasal Swab  Result Value Ref Range Status   SARS Coronavirus 2 by RT PCR NEGATIVE NEGATIVE Final    Comment: (NOTE) SARS-CoV-2 target nucleic acids are NOT DETECTED.  The SARS-CoV-2 RNA is generally detectable in upper and lower respiratory specimens during the acute phase of infection. The lowest concentration of SARS-CoV-2 viral copies this assay can detect is 250 copies / mL. A negative result does not preclude SARS-CoV-2 infection and should not be used as the sole basis for treatment or other patient management decisions.  A negative result may occur with improper specimen collection / handling, submission of specimen other than nasopharyngeal swab, presence of viral mutation(s) within the areas targeted by this assay, and  inadequate number of viral copies (<250 copies / mL). A negative result must be combined with clinical observations, patient history, and epidemiological information.  Fact Sheet for Patients:   RoadLapTop.co.za  Fact Sheet for Healthcare Providers: http://kim-miller.com/  This test is not yet approved or  cleared by the [place] FDA and has been authorized for detection and/or diagnosis of SARS-CoV-2 by FDA under an Emergency Use Authorization (EUA).  This EUA will remain in effect (meaning this test can be used) for the duration of the COVID-19 declaration under Section 564(b)(1) of the Act, 21 U.S.C. section 360bbb-3(b)(1), unless the authorization is terminated or revoked sooner.  Performed at [facility], [address] [place], [place] [address]   Urine Culture     Status: None   Collection Time: 04/18/22  2:47 AM   Specimen: In/Out Cath Urine  Result Value Ref Range Status   Specimen Description IN/OUT CATH URINE  Final   Special Requests NONE  Final   Culture   Final    NO GROWTH Performed at [facility], [address] [place], [place] [address]    Report Status 04/19/2022 FINAL  Final  Culture, blood (Routine X 2) w Reflex to ID Panel     Status: None (Preliminary result)   Collection Time: 04/18/22  3:54 AM   Specimen: BLOOD RIGHT HAND  Result Value Ref Range Status   Specimen Description BLOOD RIGHT HAND  Final   Special Requests   Final    BOTTLES DRAWN AEROBIC AND ANAEROBIC Blood Culture adequate volume   Culture   Final    NO GROWTH 4 DAYS Performed at [facility], [address], [place], [place] [address]    Report Status PENDING  Incomplete  Culture, blood (Routine X 2) w Reflex to ID Panel     Status: None (Preliminary result)   Collection Time: 04/18/22  3:54 AM   Specimen: BLOOD  Result Value Ref Range Status   Specimen Description BLOOD RIGHT ANTECUBITAL  Final   Special  Requests   Final    BOTTLES DRAWN AEROBIC AND ANAEROBIC Blood Culture adequate volume   Culture   Final    NO GROWTH 4 DAYS Performed at [facility], [address] [place], [place] [address]    Report Status PENDING  Incomplete         Radiology Studies: DG Swallowing Func-Speech Pathology  Result Date: 04/21/2022 Table formatting from the original result was not included. Objective Swallowing Evaluation: Type of Study: MBS-Modified Barium Swallow Study  Patient Details Name: Lyden Redner MRN: [id number] Date of Birth: [birth year] Today's Date: 04/21/2022 Time: SLP Start Time (ACUTE ONLY): 1005 -SLP Stop Time (ACUTE ONLY): 1025 SLP Time Calculation (min) (ACUTE ONLY): 20 min Past Medical History: Past Medical History: Diagnosis Date  High cholesterol   Hypertension  Past Surgical History: Past Surgical History: Procedure Laterality Date  NO PAST SURGERIES   HPI: Cayden Granholm is a 56 y.o. male who presented 04/17/22 after being found down at home with R sided hemiplegia, L gaze deviation, and aphasia. 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  PMH: HTN, HLD, prediabetes. Pt lives alone; has worked full time at [employer] for thirty years.  Subjective: pleasant, cooperative, non verbal  Recommendations for follow up  therapy are one component of a multi-disciplinary discharge planning process, led by the attending physician.  Recommendations may be updated based on patient status, additional functional criteria and insurance authorization. Assessment / Plan / Recommendation   04/21/2022  11:39 AM Clinical Impressions Clinical Impression Patient presents with a mod-severe oral dysphagia and a mild-moderate pharyngeal phase dysphagia as per this MBS. During oral phase, he exhibited very limited ability to masticate and orally transit soft solids and exhibited delayed anterior to posterior transit and oral residuals with all consistencies. He exhibited anterior spillage with thin,  nectar and honey thick liquids. During pharyngeal phase, patient exhibited swallow initiation delays to level of vallecular sinus with puree, soft solids and honey thick liquids and delay to pyriform sinus with nectar thick and thin liquids. Delayed pharyngeal transit observed with puree, soft solid, honey thick liquids. Flash penetration occured with nectar thick and thin liquids with penetrate staying above vocal cords and fully exiting laryngeal vestibule. One instance of silent aspiration occured when patient took sip of thin liquid barium while he still had some puree solids in oral cavity. Mild vallecular residuals observed with soft solids bolus but cleared with subsequent swallows and no significant amount of pharyngeal residuals observed at end of study. No penetration or aspiration occurred with honey thick liquids. SLP is recommending initiate PO diet of Dys 1 (puree)solids and honey thick liquids. SLP Visit Diagnosis Dysphagia, oropharyngeal phase (R13.12) Impact on safety and function Mild aspiration risk;Moderate aspiration risk     04/21/2022  11:39 AM Treatment Recommendations Treatment Recommendations Therapy as outlined in treatment plan below     04/21/2022  11:45 AM Prognosis Prognosis for Safe Diet Advancement Fair Barriers to Reach Goals Severity of deficits   04/21/2022  11:39 AM Diet Recommendations SLP Diet Recommendations Honey thick liquids;Dysphagia 1 (Puree) solids Liquid Administration via Cup Medication Administration Crushed with puree Compensations Small sips/bites;Slow rate;Minimize environmental distractions;Monitor for anterior loss Postural Changes Seated upright at 90 degrees     04/21/2022  11:39 AM Other Recommendations Oral Care Recommendations Oral care BID;Staff/trained caregiver to provide oral care;Oral care before and after PO Other Recommendations Order thickener from pharmacy;Prohibited food (jello, ice cream, thin soups);Clarify dietary restrictions;Have oral suction  available Follow Up Recommendations Skilled nursing-short term rehab (<3 hours/day) Assistance recommended at discharge Frequent or constant Supervision/Assistance Functional Status Assessment Patient has had a recent decline in their functional status and demonstrates the ability to make significant improvements in function in a reasonable and predictable amount of time.   04/21/2022  11:39 AM Frequency and Duration  Speech Therapy Frequency (ACUTE ONLY) min 2x/week     04/21/2022  11:35 AM Oral Phase Oral Phase Impaired Oral - Honey Cup Weak lingual manipulation;Reduced posterior propulsion;Lingual/palatal residue;Delayed oral transit;Piecemeal swallowing Oral - Nectar Cup Reduced posterior propulsion;Lingual/palatal residue;Weak lingual manipulation;Piecemeal swallowing;Delayed oral transit Oral - Thin Cup Reduced posterior propulsion;Delayed oral transit;Lingual/palatal residue;Weak lingual manipulation Oral - Puree Weak lingual manipulation;Reduced posterior propulsion;Delayed oral transit;Lingual/palatal residue Oral - Mech Soft Impaired mastication;Weak lingual manipulation;Delayed oral transit;Lingual/palatal residue;Holding of bolus    04/21/2022  11:37 AM Pharyngeal Phase Pharyngeal Phase Impaired Pharyngeal- Honey Cup Delayed swallow initiation-vallecula;Reduced pharyngeal peristalsis Pharyngeal- Nectar Cup Delayed swallow initiation-pyriform sinuses Pharyngeal- Thin Cup Delayed swallow initiation-pyriform sinuses;Penetration/Aspiration during swallow Pharyngeal Material enters airway, passes BELOW cords without attempt by patient to eject out (silent aspiration) Pharyngeal- Puree Delayed swallow initiation-vallecula;Pharyngeal residue - valleculae;Reduced pharyngeal peristalsis Pharyngeal- Mechanical Soft Delayed swallow initiation-vallecula;Pharyngeal residue - valleculae;Reduced pharyngeal peristalsis    04/21/2022  11:39  AM Cervical Esophageal Phase  Cervical Esophageal Phase WFL  Angela Nevin, MA,  CCC-SLP Speech Therapy                          Scheduled Meds:  amLODipine  10 mg Per Tube Daily   aspirin  81 mg Per Tube Daily   carvedilol  25 mg Oral BID WC   clopidogrel  75 mg Per Tube Daily   feeding supplement (PROSource TF)  45 mL Per Tube Daily   heparin  5,000 Units Subcutaneous Q8H   insulin aspart  0-6 Units Subcutaneous Q4H   irbesartan  75 mg Oral Daily   multivitamin with minerals  1 tablet Per Tube Daily   mouth rinse  15 mL Mouth Rinse 4 times per day   rosuvastatin  40 mg Per Tube q1800   Continuous Infusions:  feeding supplement (JEVITY 1.2 CAL) 1,000 mL (04/21/22 2048)     LOS: 5 days   Time spent:  Azucena Fallen, DO [facility]  If 7PM-7AM, please contact night-coverage [email or web address]  04/22/2022, 8:08 AM

## 2022-04-22 NOTE — Progress Notes (Signed)
Physical Therapy Treatment Patient Details Name: Alex Grant MRN: [id number] DOB: 08/01/1966 Today's Date: 04/22/2022   History of Present Illness Pt is a 56 y/o male who presented 04/17/22 after being found down at home with R sided hemiplegia, L gaze deviation, and aphasia. 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 PMH: HTN, prediabetes    PT Comments    Pt progressing towards physical therapy goals. He was able to utilize the communication board today in addition to thumbs up/down and nodding head to answer questions. Focus of session was ADL activity at the sink within the [place], as well as standing balance within [place]. Activity somewhat limited by large BM and required increased time for peri care and linen change. Will continue to follow and progress as able per POC.     Recommendations for follow up therapy are one component of a multi-disciplinary discharge planning process, led by the attending physician.  Recommendations may be updated based on patient status, additional functional criteria and insurance authorization.  Follow Up Recommendations  Skilled nursing-short term rehab (<3 hours/day) (Denied by [facility]) Can patient physically be transported by private vehicle: No   Assistance Recommended at Discharge Frequent or constant Supervision/Assistance  Patient can return home with the following Two people to help with walking and/or transfers;Two people to help with bathing/dressing/bathroom;Assistance with cooking/housework;Assistance with feeding;Direct supervision/assist for medications management;Direct supervision/assist for financial management;Assist for transportation;Help with stairs or ramp for entrance   Equipment Recommendations  Rolling walker (2 wheels);BSC/3in1;Wheelchair (measurements PT);Wheelchair cushion (measurements PT);Hospital bed;Other (comment) (lift equipment, pending progress)    Recommendations for Other Services  Rehab consult     Precautions / Restrictions Precautions Precautions: Fall Restrictions Weight Bearing Restrictions: No     Mobility  Bed Mobility Overal bed mobility: Needs Assistance Bed Mobility: Rolling, Sidelying to Sit Rolling: +2 for physical assistance, +2 for safety/equipment, Max assist, Mod assist Sidelying to sit: Mod assist, +2 for physical assistance, +2 for safety/equipment       General bed mobility comments: Mod A +2 for power up into sitting from sidelying. Cues for hand placement. Max A for rolling to L, min assist to roll R    Transfers Overall transfer level: Needs assistance Equipment used: Ambulation equipment used Transfers: Sit to/from Stand Sit to Stand: Mod assist, +2 physical assistance, +2 safety/equipment, Min assist           General transfer comment: Mod A for power up from recliner and Min A for power up from stedy flats; +2 overall and several bouts of standing within [place]. Transfer via Lift Equipment: Stedy  Ambulation/Gait               General Gait Details: Did not progress to gait training this session.   Stairs             Wheelchair Mobility    Modified Rankin (Stroke Patients Only) Modified Rankin (Stroke Patients Only) Pre-Morbid Rankin Score: No symptoms Modified Rankin: Severe disability     Balance Overall balance assessment: Needs assistance Sitting-balance support: Single extremity supported, No upper extremity supported, Feet supported Sitting balance-Leahy Scale: Poor Sitting balance - Comments: Min Guard A - Min A for sitting balance Postural control: Right lateral lean Standing balance support: Single extremity supported, During functional activity Standing balance-Leahy Scale: Poor Standing balance comment: +2 assist required, min A when in stedy for forward lean and R lateral lean.  Cognition Arousal/Alertness: Awake/alert Behavior During Therapy: Flat  affect, Impulsive Overall Cognitive Status: Impaired/Different from baseline Area of Impairment: Following commands, Safety/judgement, Awareness, Problem solving, Attention                   Current Attention Level: Sustained   Following Commands: Follows one step commands consistently, Follows one step commands with increased time, Follows multi-step commands inconsistently Safety/Judgement: Decreased awareness of safety, Decreased awareness of deficits Awareness: Intellectual Problem Solving: Slow processing, Decreased initiation, Difficulty sequencing, Requires verbal cues, Requires tactile cues General Comments: Answering question for yes/no with thumbs up and word board. Following commands and requiring increased time for initating.        Exercises      General Comments General comments (skin integrity, edema, etc.): VSS throughout session      Pertinent Vitals/Pain Pain Assessment Pain Assessment: Faces Faces Pain Scale: No hurt Pain Intervention(s): Monitored during session    Home Living                          Prior Function            PT Goals (current goals can now be found in the care plan section) Acute Rehab PT Goals Patient Stated Goal: did not state PT Goal Formulation: Patient unable to participate in goal setting Time For Goal Achievement: 05/02/22 Potential to Achieve Goals: Fair Progress towards PT goals: Progressing toward goals    Frequency    Min 4X/week      PT Plan Current plan remains appropriate    Co-evaluation PT/OT/SLP Co-Evaluation/Treatment: Yes Reason for Co-Treatment: Complexity of the patient's impairments (multi-system involvement);For patient/therapist safety;To address functional/ADL transfers PT goals addressed during session: Mobility/safety with mobility;Balance;Proper use of DME;Strengthening/ROM OT goals addressed during session: ADL's and self-care      AM-PAC PT "6 Clicks" Mobility   Outcome  Measure  Help needed turning from your back to your side while in a flat bed without using bedrails?: A Lot Help needed moving from lying on your back to sitting on the side of a flat bed without using bedrails?: A Lot Help needed moving to and from a bed to a chair (including a wheelchair)?: Total Help needed standing up from a chair using your arms (e.g., wheelchair or bedside chair)?: Total Help needed to walk in hospital room?: Total Help needed climbing 3-5 steps with a railing? : Total 6 Click Score: 8    End of Session Equipment Utilized During Treatment: Gait belt Activity Tolerance: Patient tolerated treatment well Patient left: in chair;with call bell/phone within reach;with chair alarm set;with family/visitor present Nurse Communication: Mobility status;Need for lift equipment [facility] for back to bed, need for linen change) PT Visit Diagnosis: Unsteadiness on feet (R26.81);Muscle weakness (generalized) (M62.81);Difficulty in walking, not elsewhere classified (R26.2);Other symptoms and signs involving the nervous system (R29.898)     Time: 7619-5093 PT Time Calculation (min) (ACUTE ONLY): 53 min  Charges:  $Gait Training: 8-22 mins $Therapeutic Activity: 8-22 mins                     Conni Slipper, PT, DPT Acute Rehabilitation Services Secure Chat Preferred Office: [phone]    Marylynn Pearson 04/22/2022, 11:29 AM

## 2022-04-22 NOTE — Progress Notes (Signed)
Nutrition Follow-up  DOCUMENTATION CODES:  Not applicable  INTERVENTION:  Continue current diet as recommended per SLP MVI with minerals daily Recommend continuous TF until pt is alert and taking POs consistently. Once stable, would recommend adjusting TF to nocturnal prior to removal of cortrak tube to ensure that needs can be met without EN feeds Jevity 1.2 at 70 ml/hr (1680 ml per day) Prosource TF 45 ml daily Provides 2056 kcal, 104 gm protein, 1362 ml free water daily  NUTRITION DIAGNOSIS:  Inadequate oral intake related to dysphagia as evidenced by meal completion < 50%. - revised 6/29 for diet advancement  GOAL:  Patient will meet greater than or equal to 90% of their needs - progressing, TF infusing  MONITOR:  Diet advancement, TF tolerance, PO intake, Labs  REASON FOR ASSESSMENT:  Consult Enteral/tube feeding initiation and management  ASSESSMENT:  Pt with PMH of HTN, HLD, and prediabetes who was found down at home on Saturday during well check (LKW Thursday) admitted with acute ischemic stroke, SIRS from likely aspiration, rhabdomyolysis, AKI, and metabolic acidosis.  6/26 - cortrak placed (gastric) 6/28 - SLP Evaluation, diet advanced to DYS 1 with honey thick liquids  Pt resting in bedside chair at the time of assessment, just finished working with PT. Pt very tired and not very interactive during visit, would not answer questions by pointing at his word board.  Diet was advanced yesterday but per RN pt has had minimal PO intake. Would not recommend stopping continuous feeds until pt is showing interest in food and at least taking a few bites of each meal consistently. Would recommend adjusting to nocturnal feeds at that time prior to removal of cortrak tube to avoid having to replace and order new XR for pt. Discussed with RN and MD.  Also noted high Na two days ago trending up from the previous day. Will add increased free water if labs result high today.    Nutritionally Relevant Medications: Scheduled Meds:  PROSource TF  45 mL Per Tube Daily   insulin aspart  0-6 Units Subcutaneous Q4H   multivitamin with minerals  1 tablet Per Tube Daily   rosuvastatin  40 mg Per Tube q1800   Continuous Infusions:  feeding supplement (JEVITY 1.2 CAL) 1,000 mL (04/21/22 2048)   Labs Reviewed: Na 154, chloride 120 BUN 29, creatinine 1.39 Mg 2.5 CBG ranges from 138-210 mg/dL over the last 24 hours HgbA1c 5.9% (6/25)  NUTRITION - FOCUSED PHYSICAL EXAM: Flowsheet Row Most Recent Value  Orbital Region No depletion  Upper Arm Region No depletion  Thoracic and Lumbar Region No depletion  Buccal Region No depletion  Temple Region No depletion  Clavicle Bone Region No depletion  Clavicle and Acromion Bone Region No depletion  Scapular Bone Region No depletion  Dorsal Hand No depletion  Patellar Region Mild depletion  Anterior Thigh Region Mild depletion  Posterior Calf Region Mild depletion  Edema (RD Assessment) None  Hair Reviewed  Eyes Reviewed  Mouth Reviewed  Skin Reviewed  Nails Reviewed   Diet Order:   Diet Order             DIET - DYS 1 Room service appropriate? Yes; Fluid consistency: Honey Thick  Diet effective now                   EDUCATION NEEDS:  Not appropriate for education at this time  Skin:  Skin Assessment: Reviewed RN Assessment  Last BM:  6/28  Height:  Ht Readings  from Last 1 Encounters:  04/17/22 _0  (1.727 m)    Weight:  Wt Readings from Last 1 Encounters:  04/22/22 85.3 kg    Ideal Body Weight:  70 kg  BMI:  Body mass index is 28.59 kg/m.  Estimated Nutritional Needs:  Kcal:  1900-2100 Protein:  95-115 grams Fluid:  >1.9 L/day    Ranell Patrick, RD, LDN Clinical Dietitian RD pager # available in [facility]  After hours/weekend pager # available in [facility]

## 2022-04-22 NOTE — NC FL2 (Signed)
[place] MEDICAID FL2 LEVEL OF CARE SCREENING TOOL     IDENTIFICATION  Patient Name: Alex Grant Birthdate: 09-Dec-1965 Sex: male Admission Date (Current Location): 04/17/2022  [facility] and [place] Number:  Producer, television/film/video and Address:  [facility], [address], [place], [place] [address]      Provider Number: [id number]  Attending Physician Name and Address:  Azucena Fallen, MD  Relative Name and Phone Number:       Current Level of Care: Hospital Recommended Level of Care: Skilled Nursing Facility Prior Approval Number:    Date Approved/Denied:   PASRR Number: [id number]  Discharge Plan: SNF    Current Diagnoses: Patient Active Problem List   Diagnosis Date Noted   Rhabdomyolysis 04/18/2022   HLD (hyperlipidemia) 04/18/2022   Dysphagia 04/18/2022   Acute ischemic stroke (HCC) 04/17/2022   AKI (acute kidney injury) (HCC) 04/17/2022   Metabolic acidosis 04/17/2022   Elevated LFTs 04/17/2022   Hypertension    SIRS (systemic inflammatory response syndrome) (HCC)    Prolonged QT interval    Prediabetes    Leukocytosis    Polycythemia    Right thyroid nodule    Hypertensive urgency 06/27/2014   Chest pain 06/26/2014    Orientation RESPIRATION BLADDER Height & Weight     Self  Normal Incontinent Weight: 188 lb 0.8 oz (85.3 kg) Height:  5\' 8"  (172.7 cm)  BEHAVIORAL SYMPTOMS/MOOD NEUROLOGICAL BOWEL NUTRITION STATUS      Continent Diet (see DC summary)  AMBULATORY STATUS COMMUNICATION OF NEEDS Skin   Extensive Assist Non-Verbally Normal                       Personal Care Assistance Level of Assistance  Bathing, Feeding, Dressing Bathing Assistance: Maximum assistance Feeding assistance: Maximum assistance Dressing Assistance: Maximum assistance     Functional Limitations Info  Speech     Speech Info: Impaired (expressive aphasia, uses communication board)    SPECIAL CARE FACTORS FREQUENCY  PT (By licensed  PT), OT (By licensed OT), Speech therapy     PT Frequency: 5x/wk OT Frequency: 5x/wk     Speech Therapy Frequency: 5x/wk      Contractures Contractures Info: Not present    Additional Factors Info  Code Status, Allergies, Insulin Sliding Scale Code Status Info: Full Allergies Info: Amoxil (Amoxicillin), Atorvastatin, Lisinopril   Insulin Sliding Scale Info: see DC summary       Current Medications (04/22/2022):  This is the current hospital active medication list Current Facility-Administered Medications  Medication Dose Route Frequency Provider Last Rate Last Admin   acetaminophen (TYLENOL) tablet 650 mg  650 mg Oral Q4H PRN Opyd, 04/24/2022, MD       Or   acetaminophen (TYLENOL) 160 MG/5ML solution 650 mg  650 mg Per Tube Q4H PRN Opyd, Lavone Neri, MD       Or   acetaminophen (TYLENOL) suppository 650 mg  650 mg Rectal Q4H PRN Opyd, Lavone Neri, MD   650 mg at 04/18/22 0203   amLODipine (NORVASC) tablet 10 mg  10 mg Per Tube Daily 04/20/22, MD   10 mg at 04/22/22 0845   aspirin chewable tablet 81 mg  81 mg Per Tube Daily 04/24/22, MD   81 mg at 04/22/22 0844   carvedilol (COREG) tablet 25 mg  25 mg Oral BID WC 04/24/22, MD   25 mg at 04/22/22 0845   clopidogrel (PLAVIX) tablet 75 mg  75 mg Per Tube Daily Marvel Plan, MD   75 mg at 04/22/22 0845   feeding supplement (JEVITY 1.2 CAL) liquid 1,000 mL  1,000 mL Per Tube Continuous Lewie Chamber, MD 70 mL/hr at 04/21/22 2048 1,000 mL at 04/21/22 2048   feeding supplement (PROSource TF) liquid 45 mL  45 mL Per Tube Daily Lewie Chamber, MD   45 mL at 04/22/22 0845   heparin injection 5,000 Units  5,000 Units Subcutaneous Q8H Opyd, Lavone Neri, MD   5,000 Units at 04/22/22 0425   insulin aspart (novoLOG) injection 0-6 Units  0-6 Units Subcutaneous Q4H Opyd, Lavone Neri, MD   1 Units at 04/22/22 0844   irbesartan (AVAPRO) tablet 75 mg  75 mg Oral Daily Azucena Fallen, MD   75 mg at 04/22/22 0845   labetalol (NORMODYNE)  injection 10 mg  10 mg Intravenous Q4H PRN Lewie Chamber, MD   10 mg at 04/20/22 0846   multivitamin with minerals tablet 1 tablet  1 tablet Per Tube Daily Lewie Chamber, MD   1 tablet at 04/22/22 0845   Oral care mouth rinse  15 mL Mouth Rinse 4 times per day Lewie Chamber, MD   15 mL at 04/22/22 0846   Oral care mouth rinse  15 mL Mouth Rinse PRN Lewie Chamber, MD       rosuvastatin (CRESTOR) tablet 40 mg  40 mg Per Tube [id number] Marvel Plan, MD   40 mg at 04/21/22 1703     Discharge Medications: Please see discharge summary for a list of discharge medications.  Relevant Imaging Results:  Relevant Lab Results:   Additional Information SS#: [id number]  Baldemar Lenis, LCSW

## 2022-04-22 NOTE — Progress Notes (Signed)
Speech Language Pathology Treatment: Dysphagia;Cognitive-Linquistic  Patient Details Name: Alex Grant MRN: [id number] DOB: 08-Apr-1966 Today's Date: 04/22/2022 Time: 3154-0086 SLP Time Calculation (min) (ACUTE ONLY): 30 min  Assessment / Plan / Recommendation Clinical Impression  Pt seen for dysphagia tx with recommended consistency from MBS completed on 04/21/22 of Dysphagia 1(puree)/Honey-thickened liquids with min verbal/visual cues provided for small guided straw sips of honey-thickened liquids and puree consistencies.  Multiple swallows noted with these consistencies and repetitive dry swallows/effortful swallow cued.  Throat clearing and cough attempted, but min voice obtained during these attempts.  Pt able to self feed honey-thickened liquids via straw with set up, but puree required assistance d/t dominant hand affected.  Continue current diet of Dysphagia 1/honey-thickened liquids.  Pt seen for speech/language tx with focus on AAC utilizing white board/marker and gestures/non-verbal cues to communicate within session.  Pt wrote single words to express self when asked questions.  He used thumbs up and "so-so" sign when asked questions as well.  He was able to follow commands and attempted to vocalize when asked.  Min voice noted when pt became tearful/labile during session and when asked to clear throat.  Automatic tasks attempted, but unsuccessful including singing familiar tunes (Happy Birthday), counting and sentence completion tasks.  Discussed with pt/family need for AAC device for communication efforts or using gestures/head nod/shake when having needs/wants met.  Pt/family in agreement.  HPI HPI: Alex Grant is a 56 y.o. male who presented 04/17/22 after being found down at home with R sided hemiplegia, L gaze deviation, and aphasia. 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  PMH: HTN, HLD, prediabetes. Pt lives alone; has worked full time at [employer] for thirty years;  MBS completed on 04/21/22 with recs for D1/HTL.  ST f/u for speech/language and dysphagia tx.      SLP Plan  Continue with current plan of care      Recommendations for follow up therapy are one component of a multi-disciplinary discharge planning process, led by the attending physician.  Recommendations may be updated based on patient status, additional functional criteria and insurance authorization.    Recommendations  Diet recommendations: Dysphagia 1 (puree);Honey-thick liquid Liquids provided via: Cup;Straw Medication Administration: Crushed with puree (or via IV) Supervision: Staff to assist with self feeding;Full supervision/cueing for compensatory strategies Compensations: Small sips/bites;Slow rate;Multiple dry swallows after each bite/sip;Effortful swallow Postural Changes and/or Swallow Maneuvers: Seated upright 90 degrees                Oral Care Recommendations: Oral care BID;Staff/trained caregiver to provide oral care;Other (Comment) Follow Up Recommendations: Skilled nursing-short term rehab (<3 hours/day) Assistance recommended at discharge: Frequent or constant Supervision/Assistance SLP Visit Diagnosis: Dysphagia, oropharyngeal phase (R13.12);Aphasia (R47.01) Plan: Continue with current plan of care           Elvina Sidle, M.S., CCC-SLP  04/22/2022, 4:24 PM

## 2022-04-23 LAB — GLUCOSE, CAPILLARY
Glucose-Capillary: 115 mg/dL — ABNORMAL HIGH (ref 70–99)
Glucose-Capillary: 115 mg/dL — ABNORMAL HIGH (ref 70–99)
Glucose-Capillary: 154 mg/dL — ABNORMAL HIGH (ref 70–99)
Glucose-Capillary: 159 mg/dL — ABNORMAL HIGH (ref 70–99)
Glucose-Capillary: 161 mg/dL — ABNORMAL HIGH (ref 70–99)
Glucose-Capillary: 172 mg/dL — ABNORMAL HIGH (ref 70–99)
Glucose-Capillary: 175 mg/dL — ABNORMAL HIGH (ref 70–99)
Glucose-Capillary: 180 mg/dL — ABNORMAL HIGH (ref 70–99)

## 2022-04-23 LAB — BASIC METABOLIC PANEL
Anion gap: 8 (ref 5–15)
BUN: 31 mg/dL — ABNORMAL HIGH (ref 6–20)
CO2: 24 mmol/L (ref 22–32)
Calcium: 8.5 mg/dL — ABNORMAL LOW (ref 8.9–10.3)
Chloride: 111 mmol/L (ref 98–111)
Creatinine, Ser: 0.96 mg/dL (ref 0.61–1.24)
GFR, Estimated: >60 mL/min (ref >60)
Glucose, Bld: 157 mg/dL — ABNORMAL HIGH (ref 70–99)
Potassium: 3.6 mmol/L (ref 3.5–5.1)
Sodium: 143 mmol/L (ref 135–145)

## 2022-04-23 LAB — CBC
HCT: 44 % (ref 39.0–52.0)
Hemoglobin: 13.9 g/dL (ref 13.0–17.0)
MCH: 28.8 pg (ref 26.0–34.0)
MCHC: 31.6 g/dL (ref 30.0–36.0)
MCV: 91.1 fL (ref 80.0–100.0)
Platelets: 200 10*3/uL (ref 150–400)
RBC: 4.83 MIL/uL (ref 4.22–5.81)
RDW: 13.5 % (ref 11.5–15.5)
WBC: 14.5 10*3/uL — ABNORMAL HIGH (ref 4.0–10.5)
nRBC: 0 % (ref 0.0–0.2)

## 2022-04-23 LAB — CULTURE, BLOOD (ROUTINE X 2)
Culture: NO GROWTH
Culture: NO GROWTH
Special Requests: ADEQUATE
Special Requests: ADEQUATE

## 2022-04-23 LAB — MAGNESIUM: Magnesium: 2.2 mg/dL (ref 1.7–2.4)

## 2022-04-23 NOTE — Plan of Care (Signed)
  Problem: Education: Goal: Ability to describe self-care measures that may prevent or decrease complications (Diabetes Survival Skills Education) will improve Outcome: Progressing Goal: Individualized Educational Video(s) Outcome: Progressing   Problem: Coping: Goal: Ability to adjust to condition or change in health will improve Outcome: Progressing   Problem: Fluid Volume: Goal: Ability to maintain a balanced intake and output will improve Outcome: Progressing   Problem: Health Behavior/Discharge Planning: Goal: Ability to identify and utilize available resources and services will improve Outcome: Progressing Goal: Ability to manage health-related needs will improve Outcome: Progressing   Problem: Metabolic: Goal: Ability to maintain appropriate glucose levels will improve Outcome: Progressing   Problem: Nutritional: Goal: Maintenance of adequate nutrition will improve Outcome: Progressing Goal: Progress toward achieving an optimal weight will improve Outcome: Progressing   Problem: Skin Integrity: Goal: Risk for impaired skin integrity will decrease Outcome: Progressing   Problem: Tissue Perfusion: Goal: Adequacy of tissue perfusion will improve Outcome: Progressing   Problem: Education: Goal: Knowledge of General Education information will improve Description: Including pain rating scale, medication(s)/side effects and non-pharmacologic comfort measures Outcome: Progressing   Problem: Health Behavior/Discharge Planning: Goal: Ability to manage health-related needs will improve Outcome: Progressing   Problem: Clinical Measurements: Goal: Ability to maintain clinical measurements within normal limits will improve Outcome: Progressing Goal: Will remain free from infection Outcome: Progressing Goal: Diagnostic test results will improve Outcome: Progressing Goal: Respiratory complications will improve Outcome: Progressing Goal: Cardiovascular complication will  be avoided Outcome: Progressing   Problem: Activity: Goal: Risk for activity intolerance will decrease Outcome: Progressing   Problem: Nutrition: Goal: Adequate nutrition will be maintained Outcome: Progressing   Problem: Coping: Goal: Level of anxiety will decrease Outcome: Progressing   Problem: Elimination: Goal: Will not experience complications related to bowel motility Outcome: Progressing Goal: Will not experience complications related to urinary retention Outcome: Progressing   Problem: Pain Managment: Goal: General experience of comfort will improve Outcome: Progressing   Problem: Safety: Goal: Ability to remain free from injury will improve Outcome: Progressing   Problem: Skin Integrity: Goal: Risk for impaired skin integrity will decrease Outcome: Progressing   Problem: Education: Goal: Knowledge of disease or condition will improve Outcome: Progressing Goal: Knowledge of secondary prevention will improve (SELECT ALL) Outcome: Progressing Goal: Knowledge of patient specific risk factors will improve (INDIVIDUALIZE FOR PATIENT) Outcome: Progressing   Problem: Coping: Goal: Will verbalize positive feelings about self Outcome: Progressing Goal: Will identify appropriate support needs Outcome: Progressing   Problem: Health Behavior/Discharge Planning: Goal: Ability to manage health-related needs will improve Outcome: Progressing   Problem: Self-Care: Goal: Ability to participate in self-care as condition permits will improve Outcome: Progressing Goal: Verbalization of feelings and concerns over difficulty with self-care will improve Outcome: Progressing Goal: Ability to communicate needs accurately will improve Outcome: Progressing   Problem: Nutrition: Goal: Risk of aspiration will decrease Outcome: Progressing Goal: Dietary intake will improve Outcome: Progressing   Problem: Ischemic Stroke/TIA Tissue Perfusion: Goal: Complications of  ischemic stroke/TIA will be minimized Outcome: Progressing

## 2022-04-23 NOTE — Progress Notes (Signed)
Physical Therapy Treatment Patient Details Name: Alex Grant MRN: [id number] DOB: 11-18-1965 Today's Date: 04/23/2022   History of Present Illness Pt is a 56 y/o male who presented 04/17/22 after being found down at home with R sided hemiplegia, L gaze deviation, and aphasia. 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 PMH: HTN, prediabetes    PT Comments    Patient received in bed, alert, not very responsive. Will give occasional thumbs up. Family at bedside. He requires mod A +2 for bed mobility. Poor sitting balance. Min A with sit to stand from bed to stedy. Max cues for upright posture in standing. Easily fatigued. Heavy right lean in standing. Patient will continue to benefit from skilled PT to improve functional independence strength and safety.        Recommendations for follow up therapy are one component of a multi-disciplinary discharge planning process, led by the attending physician.  Recommendations may be updated based on patient status, additional functional criteria and insurance authorization.  Follow Up Recommendations  Skilled nursing-short term rehab (<3 hours/day) Can patient physically be transported by private vehicle: No   Assistance Recommended at Discharge Frequent or constant Supervision/Assistance  Patient can return home with the following Two people to help with walking and/or transfers;Two people to help with bathing/dressing/bathroom;Assistance with cooking/housework;Assistance with feeding;Direct supervision/assist for medications management;Direct supervision/assist for financial management;Assist for transportation;Help with stairs or ramp for entrance   Equipment Recommendations  BSC/3in1;Wheelchair (measurements PT);Wheelchair cushion (measurements PT);Hospital bed;Other (comment)    Recommendations for Other Services       Precautions / Restrictions Precautions Precautions: Fall Precaution Comments: NG  tube Restrictions Weight Bearing Restrictions: No     Mobility  Bed Mobility Overal bed mobility: Needs Assistance Bed Mobility: Supine to Sit     Supine to sit: Mod assist, +2 for physical assistance, HOB elevated     General bed mobility comments: mod A +2 for supine to sit. Cues to use bed rail and push up from elbow to sitting    Transfers Overall transfer level: Needs assistance Equipment used: Ambulation equipment used Transfers: Sit to/from Stand Sit to Stand: Min assist, +2 physical assistance, +2 safety/equipment           General transfer comment: min A to power up from bed to standing. Max cues needed for getting fully standing. Easily fatigued. 3 reps of sit to stand performed focusing on erect balanced standing. Transfer via Lift Equipment: Stedy  Ambulation/Gait               General Gait Details: Did not progress to gait training this session.   Stairs             Wheelchair Mobility    Modified Rankin (Stroke Patients Only)       Balance Overall balance assessment: Needs assistance Sitting-balance support: Feet supported Sitting balance-Leahy Scale: Poor Sitting balance - Comments: Min Guard A - Min A for sitting balance Postural control: Right lateral lean Standing balance support: Single extremity supported, During functional activity, Reliant on assistive device for balance Standing balance-Leahy Scale: Poor Standing balance comment: +2 assist required, min A when in stedy for forward lean and R lateral lean.                            Cognition Arousal/Alertness: Awake/alert Behavior During Therapy: Flat affect Overall Cognitive Status: Impaired/Different from baseline Area of Impairment: Following commands, Safety/judgement, Awareness,  Problem solving, Attention                   Current Attention Level: Sustained   Following Commands: Follows one step commands consistently, Follows one step commands  with increased time, Follows multi-step commands inconsistently Safety/Judgement: Decreased awareness of safety, Decreased awareness of deficits Awareness: Intellectual Problem Solving: Slow processing, Decreased initiation, Difficulty sequencing, Requires verbal cues, Requires tactile cues General Comments: less responsive this session. Will give occasional thumbs up but did not seem real interested in attempting to communicate        Exercises Other Exercises Other Exercises: R LE stretching/ROM due to increased tone    General Comments        Pertinent Vitals/Pain Pain Assessment Pain Assessment: No/denies pain    Home Living                          Prior Function            PT Goals (current goals can now be found in the care plan section) Acute Rehab PT Goals Patient Stated Goal: did not state PT Goal Formulation: Patient unable to participate in goal setting Time For Goal Achievement: 05/02/22 Potential to Achieve Goals: Fair Progress towards PT goals: Progressing toward goals    Frequency    Min 4X/week      PT Plan Current plan remains appropriate    Co-evaluation              AM-PAC PT "6 Clicks" Mobility   Outcome Measure  Help needed turning from your back to your side while in a flat bed without using bedrails?: A Lot Help needed moving from lying on your back to sitting on the side of a flat bed without using bedrails?: A Lot Help needed moving to and from a bed to a chair (including a wheelchair)?: Total Help needed standing up from a chair using your arms (e.g., wheelchair or bedside chair)?: Total Help needed to walk in hospital room?: Total Help needed climbing 3-5 steps with a railing? : Total 6 Click Score: 8    End of Session Equipment Utilized During Treatment: Gait belt Activity Tolerance: Patient tolerated treatment well Patient left: in chair;with call bell/phone within reach;with family/visitor present Nurse  Communication: Mobility status;Need for lift equipment PT Visit Diagnosis: Other abnormalities of gait and mobility (R26.89);Unsteadiness on feet (R26.81);Difficulty in walking, not elsewhere classified (R26.2);Hemiplegia and hemiparesis Hemiplegia - Right/Left: Right Hemiplegia - caused by: Cerebral infarction     Time: 1310-1335 PT Time Calculation (min) (ACUTE ONLY): 25 min  Charges:  $Therapeutic Activity: 23-37 mins                     Zi Sek, PT, GCS 04/23/22,1:59 PM

## 2022-04-23 NOTE — Progress Notes (Addendum)
PROGRESS NOTE    Alex Grant  YQI:[id number] DOB: 02-11-1966 DOA: 04/17/2022 PCP: Patient, No Pcp Per   Brief Narrative:  Alex Grant is a 56 yo male with PMH HTN, HLD, prediabetes who presented via EMS after being found down at home.  Patient was unable to provide any information due to unresponsiveness on admission and all collateral information was obtained from family.  It appears he was last communicated with on Thursday via phone conversation with his dad.  When EMS found him, he was still in bed when found during welfare check on Saturday.  CT head showed acute infarct involving left basal ganglia, no hemorrhage.  MRI brain also performed after admission which also confirmed acute infarct involving left basal ganglia and corona radiata.    Patient continues to work with SLP, PT,   Assessment & Plan:   Principal Problem:   Acute ischemic stroke (HCC) Active Problems:   SIRS (systemic inflammatory response syndrome) (HCC)   Dysphagia   AKI (acute kidney injury) (HCC)   Rhabdomyolysis   Metabolic acidosis   Elevated LFTs   Hypertension   Prolonged QT interval   Prediabetes   Right thyroid nodule   HLD (hyperlipidemia)  Acute ischemic stroke (HCC) - CT head and MRI brain revealed left basal ganglia and corona radiata stroke - Patient has dense right-sided hemiparesis and appears to have right-sided neglect and mutism -can communicates her left hand thumbs up thumbs down and writing - continue DAPT per neuro (asa for 3 weeks then continue on Plavix only) - Cortrak placement on 6/26 -we will need to discuss definitive enteral nutrition if speech evaluation continues to be poor - statin on hold until LFTs improve further  -Tentative plan for 30 day monitor at discharge per neuro rec's - If no significant improvement in the next few days or any further decline, low threshold for consulting palliative care. Family at this time still wishing to remain semi aggressive and is hopeful  for some recovery   Dysphagia - Dysphagia improving somewhat -follow I/O and caloric/free water intake today - potential to DC cortrak later today pending intake.   Hypertensive emergency - Permissive hypertension window closed - Goal systolic blood pressure 130-140 - Continues to be moderately well controlled on current regimen - avoid hypotension.  SIRS (systemic inflammatory response syndrome) (HCC) -SEPSIS ruled out - Procalcitonin negative, single febrile event - likely reactive in the setting of large stroke as above as well as profound rhabdomyolysis -Imaging and work-up thus far remain negative -Pneumonia ruled out (remains high risk for aspiration given above dysphagia)   Rhabdomyolysis, resolving - Resolving - increase PO intake as tolerated - CK 3,583 on admission - downtrending appropriately - no longer following - continue IVF/free water flushes and tube feeds   AKI (acute kidney injury) (HCC), resolved - baseline creatinine ~ 1 - Likely secondary to profound rhabdo vs ATN from prolonged pre-renal (still at risk) - Renal function downtrending appropriately   HLD (hyperlipidemia) - CHL 317, TG 345, HDL 31, LDL 217 - Will eventually need statin but on hold for now in setting of elevated LFTs   Right thyroid nodule - Incidentally noted on CT on admission - 2.2 cm right thyroid nodule - Outpatient thyroid ultrasound and further work-up   Prediabetes - A1c 5.9% - Diet control once able to initiate tube feeds   Prolonged QT interval - QTtc 519 on EKG - monitor K and Mg     Elevated LFTs - elevated AST/ALT. No signs  of obstructive pattern - suspected from acute illness/rhabdo -Hepatitis panel negative - hold off on statin for CVA until improvement - continue trending    Metabolic acidosis - See AKI and rhabdomyolysis - Continue fluids - Trend BMP  DVT prophylaxis: Heparin Code Status: Full Family Communication: None present -called both father and  stepmother with no answer  Status is: Inpatient  Dispo: The patient is from: Home              Anticipated d/c is to: To be determined              Anticipated d/c date is: 72+ hours              Patient currently not medically stable for discharge  Consultants:  Neurology  Procedures:  None  Antimicrobials:  Discontinued 627  Subjective: No acute issues or events overnight, continue to discuss ongoing therapy with patient, review of systems limited given patient's verbal status.  Objective: Vitals:   04/23/22 0009 04/23/22 0100 04/23/22 0342 04/23/22 0500  BP: 138/60 138/60 (!) 140/59   Pulse: 69 65 76   Resp:  19 19   Temp: 98.2 F (36.8 C) 98 F (36.7 C) 98 F (36.7 C)   TempSrc: Oral Oral Oral   SpO2: 97% 98% 94%   Weight:    85.3 kg  Height:        Intake/Output Summary (Last 24 hours) at 04/23/2022 0740 Last data filed at 04/22/2022 1700 Gross per 24 hour  Intake 660 ml  Output 600 ml  Net 60 ml    Filed Weights   04/21/22 0433 04/22/22 0422 04/23/22 0500  Weight: 85.5 kg 85.3 kg 85.3 kg    Examination:  General:  Pleasantly resting in bed, No acute distress. HEENT: Expressive aphasia without airway compromise, core track NG tube in place Lungs:  Clear to auscultate bilaterally without rhonchi, wheeze, or rales. Heart:  Regular rate and rhythm.  Without murmurs, rubs, or gallops. Abdomen:  Soft, nontender, nondistended.  Without guarding or rebound. Extremities: Profound right-sided weakness 1 out of 5 strength globally Vascular:  Dorsalis pedis and posterior tibial pulses palpable bilaterally. Skin:  Warm and dry, no erythema, no ulcerations.  Data Reviewed: I have personally reviewed following labs and imaging studies  CBC: Recent Labs  Lab 04/17/22 1716 04/17/22 1722 04/19/22 0021 04/20/22 0747 04/21/22 0436 04/22/22 0355 04/23/22 0457  WBC 27.1*   < > 18.7* 12.5* 10.2 13.1* 14.5*  NEUTROABS 23.5*  --  14.9* 9.1* 6.8 8.7*  --   HGB  20.4*   < > 17.0 15.9 16.6 15.8 13.9  HCT 61.1*   < > 52.9* 50.3 52.1* 49.6 44.0  MCV 88.2   < > 91.0 90.8 92.0 92.4 91.1  PLT 493*   < > 338 284 248 223 200   < > = values in this interval not displayed.    Basic Metabolic Panel: Recent Labs  Lab 04/17/22 1716 04/17/22 1722 04/18/22 0354 04/19/22 0021 04/19/22 1657 04/20/22 0747 04/20/22 1817 04/21/22 0436 04/21/22 1754 04/22/22 0355 04/23/22 0457  NA 144 145 146* 149*  --  154*  --   --   --   --  143  K 4.3 4.2 4.0 3.8  --  3.5  --   --   --   --  3.6  CL 110 115* 115* 117*  --  120*  --   --   --   --  111  CO2 17*  --  19* 21*  --  23  --   --   --   --  24  GLUCOSE 172* 171* 141* 130*  --  160*  --   --   --   --  157*  BUN 61* 61* 68* 44*  --  29*  --   --   --   --  31*  CREATININE 3.39* 3.50* 2.95* 1.73*  --  1.39*  --   --   --   --  0.96  CALCIUM 9.9  --  8.8* 8.3*  --  8.7*  --   --   --   --  8.5*  MG 3.1*  --   --  3.1*  --  3.0* 2.9* 2.9* 2.6* 2.5* 2.2  PHOS  --   --   --  4.5 4.1 3.0 2.5  --   --   --   --     GFR: Estimated Creatinine Clearance: 90.3 mL/min (by C-G formula based on SCr of 0.96 mg/dL). Liver Function Tests: Recent Labs  Lab 04/17/22 1716 04/18/22 0354 04/19/22 0021 04/20/22 0747  AST 83* 75* 80* 83*  ALT 55* 47* 54* 69*  ALKPHOS 83 66 71 60  BILITOT 0.8 0.9 0.8 0.7  PROT 8.5* 7.0 6.5 6.4*  ALBUMIN 4.0 3.3* 3.2* 3.1*    No results for input(s): "LIPASE", "AMYLASE" in the last 168 hours. No results for input(s): "AMMONIA" in the last 168 hours. Coagulation Profile: Recent Labs  Lab 04/17/22 1716 04/17/22 1830  INR SPECIMEN/CONTAINER TYPE INAPPROPRIATE FOR ORDERED TEST, UNABLE TO PERFORM 1.2    Cardiac Enzymes: Recent Labs  Lab 04/17/22 1716 04/18/22 0335 04/19/22 0021 04/20/22 0747  CKTOTAL 3,583* 3,327* 2,784* 1,540*    BNP (last 3 results) No results for input(s): "PROBNP" in the last 8760 hours. HbA1C: No results for input(s): "HGBA1C" in the last 72  hours.  CBG: Recent Labs  Lab 04/22/22 1618 04/22/22 1935 04/23/22 0007 04/23/22 0330 04/23/22 0542  GLUCAP 210* 157* 159* 175* 154*    Lipid Profile: No results for input(s): "CHOL", "HDL", "LDLCALC", "TRIG", "CHOLHDL", "LDLDIRECT" in the last 72 hours.  Thyroid Function Tests: No results for input(s): "TSH", "T4TOTAL", "FREET4", "T3FREE", "THYROIDAB" in the last 72 hours. Anemia Panel: No results for input(s): "VITAMINB12", "FOLATE", "FERRITIN", "TIBC", "IRON", "RETICCTPCT" in the last 72 hours. Sepsis Labs: Recent Labs  Lab 04/18/22 0335 04/18/22 0354 04/19/22 0021 04/20/22 0747  PROCALCITON 0.14  --  <0.10 <0.10  LATICACIDVEN  --  1.3  --   --      Recent Results (from the past 240 hour(s))  SARS Coronavirus 2 by RT PCR (hospital order, performed in [facility] hospital lab) *cepheid single result test* Anterior Nasal Swab     Status: None   Collection Time: 04/17/22  9:20 PM   Specimen: Anterior Nasal Swab  Result Value Ref Range Status   SARS Coronavirus 2 by RT PCR NEGATIVE NEGATIVE Final    Comment: (NOTE) SARS-CoV-2 target nucleic acids are NOT DETECTED.  The SARS-CoV-2 RNA is generally detectable in upper and lower respiratory specimens during the acute phase of infection. The lowest concentration of SARS-CoV-2 viral copies this assay can detect is 250 copies / mL. A negative result does not preclude SARS-CoV-2 infection and should not be used as the sole basis for treatment or other patient management decisions.  A negative result may occur with improper specimen collection / handling, submission of specimen other than nasopharyngeal swab, presence of  viral mutation(s) within the areas targeted by this assay, and inadequate number of viral copies (<250 copies / mL). A negative result must be combined with clinical observations, patient history, and epidemiological information.  Fact Sheet for Patients:    RoadLapTop.co.za  Fact Sheet for Healthcare Providers: http://kim-miller.com/  This test is not yet approved or  cleared by the [place] FDA and has been authorized for detection and/or diagnosis of SARS-CoV-2 by FDA under an Emergency Use Authorization (EUA).  This EUA will remain in effect (meaning this test can be used) for the duration of the COVID-19 declaration under Section 564(b)(1) of the Act, 21 U.S.C. section 360bbb-3(b)(1), unless the authorization is terminated or revoked sooner.  Performed at [facility], [address], [place], [place] [address]   Urine Culture     Status: None   Collection Time: 04/18/22  2:47 AM   Specimen: In/Out Cath Urine  Result Value Ref Range Status   Specimen Description IN/OUT CATH URINE  Final   Special Requests NONE  Final   Culture   Final    NO GROWTH Performed at [facility], [address], [place], [place] [address]    Report Status 04/19/2022 FINAL  Final  Culture, blood (Routine X 2) w Reflex to ID Panel     Status: None (Preliminary result)   Collection Time: 04/18/22  3:54 AM   Specimen: BLOOD RIGHT HAND  Result Value Ref Range Status   Specimen Description BLOOD RIGHT HAND  Final   Special Requests   Final    BOTTLES DRAWN AEROBIC AND ANAEROBIC Blood Culture adequate volume   Culture   Final    NO GROWTH 4 DAYS Performed at [facility], [address] [place], [place] [address]    Report Status PENDING  Incomplete  Culture, blood (Routine X 2) w Reflex to ID Panel     Status: None (Preliminary result)   Collection Time: 04/18/22  3:54 AM   Specimen: BLOOD  Result Value Ref Range Status   Specimen Description BLOOD RIGHT ANTECUBITAL  Final   Special Requests   Final    BOTTLES DRAWN AEROBIC AND ANAEROBIC Blood Culture adequate volume   Culture   Final    NO GROWTH 4 DAYS Performed at [facility], [address] [place], [place]  [address]    Report Status PENDING  Incomplete         Radiology Studies: DG Swallowing Func-Speech Pathology  Result Date: 04/21/2022 Table formatting from the original result was not included. Objective Swallowing Evaluation: Type of Study: MBS-Modified Barium Swallow Study  Patient Details Name: Demir Titsworth MRN: [id number] Date of Birth: 1966-01-24 Today's Date: 04/21/2022 Time: SLP Start Time (ACUTE ONLY): 1005 -SLP Stop Time (ACUTE ONLY): 1025 SLP Time Calculation (min) (ACUTE ONLY): 20 min Past Medical History: Past Medical History: Diagnosis Date  High cholesterol   Hypertension  Past Surgical History: Past Surgical History: Procedure Laterality Date  NO PAST SURGERIES   HPI: Kerney Hopfensperger is a 56 y.o. male who presented 04/17/22 after being found down at home with R sided hemiplegia, L gaze deviation, and aphasia. 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  PMH: HTN, HLD, prediabetes. Pt lives alone; has worked full time at [employer] for thirty years.  Subjective: pleasant, cooperative, non verbal  Recommendations for follow up therapy are one component of a multi-disciplinary discharge planning process, led by the attending physician.  Recommendations may be updated  based on patient status, additional functional criteria and insurance authorization. Assessment / Plan / Recommendation   04/21/2022  11:39 AM Clinical Impressions Clinical Impression Patient presents with a mod-severe oral dysphagia and a mild-moderate pharyngeal phase dysphagia as per this MBS. During oral phase, he exhibited very limited ability to masticate and orally transit soft solids and exhibited delayed anterior to posterior transit and oral residuals with all consistencies. He exhibited anterior spillage with thin, nectar and honey thick liquids. During pharyngeal phase, patient exhibited swallow initiation delays to level of vallecular sinus with puree, soft solids and honey thick liquids and delay to pyriform  sinus with nectar thick and thin liquids. Delayed pharyngeal transit observed with puree, soft solid, honey thick liquids. Flash penetration occured with nectar thick and thin liquids with penetrate staying above vocal cords and fully exiting laryngeal vestibule. One instance of silent aspiration occured when patient took sip of thin liquid barium while he still had some puree solids in oral cavity. Mild vallecular residuals observed with soft solids bolus but cleared with subsequent swallows and no significant amount of pharyngeal residuals observed at end of study. No penetration or aspiration occurred with honey thick liquids. SLP is recommending initiate PO diet of Dys 1 (puree)solids and honey thick liquids. SLP Visit Diagnosis Dysphagia, oropharyngeal phase (R13.12) Impact on safety and function Mild aspiration risk;Moderate aspiration risk     04/21/2022  11:39 AM Treatment Recommendations Treatment Recommendations Therapy as outlined in treatment plan below     04/21/2022  11:45 AM Prognosis Prognosis for Safe Diet Advancement Fair Barriers to Reach Goals Severity of deficits   04/21/2022  11:39 AM Diet Recommendations SLP Diet Recommendations Honey thick liquids;Dysphagia 1 (Puree) solids Liquid Administration via Cup Medication Administration Crushed with puree Compensations Small sips/bites;Slow rate;Minimize environmental distractions;Monitor for anterior loss Postural Changes Seated upright at 90 degrees     04/21/2022  11:39 AM Other Recommendations Oral Care Recommendations Oral care BID;Staff/trained caregiver to provide oral care;Oral care before and after PO Other Recommendations Order thickener from pharmacy;Prohibited food (jello, ice cream, thin soups);Clarify dietary restrictions;Have oral suction available Follow Up Recommendations Skilled nursing-short term rehab (<3 hours/day) Assistance recommended at discharge Frequent or constant Supervision/Assistance Functional Status Assessment Patient has  had a recent decline in their functional status and demonstrates the ability to make significant improvements in function in a reasonable and predictable amount of time.   04/21/2022  11:39 AM Frequency and Duration  Speech Therapy Frequency (ACUTE ONLY) min 2x/week     04/21/2022  11:35 AM Oral Phase Oral Phase Impaired Oral - Honey Cup Weak lingual manipulation;Reduced posterior propulsion;Lingual/palatal residue;Delayed oral transit;Piecemeal swallowing Oral - Nectar Cup Reduced posterior propulsion;Lingual/palatal residue;Weak lingual manipulation;Piecemeal swallowing;Delayed oral transit Oral - Thin Cup Reduced posterior propulsion;Delayed oral transit;Lingual/palatal residue;Weak lingual manipulation Oral - Puree Weak lingual manipulation;Reduced posterior propulsion;Delayed oral transit;Lingual/palatal residue Oral - Mech Soft Impaired mastication;Weak lingual manipulation;Delayed oral transit;Lingual/palatal residue;Holding of bolus    04/21/2022  11:37 AM Pharyngeal Phase Pharyngeal Phase Impaired Pharyngeal- Honey Cup Delayed swallow initiation-vallecula;Reduced pharyngeal peristalsis Pharyngeal- Nectar Cup Delayed swallow initiation-pyriform sinuses Pharyngeal- Thin Cup Delayed swallow initiation-pyriform sinuses;Penetration/Aspiration during swallow Pharyngeal Material enters airway, passes BELOW cords without attempt by patient to eject out (silent aspiration) Pharyngeal- Puree Delayed swallow initiation-vallecula;Pharyngeal residue - valleculae;Reduced pharyngeal peristalsis Pharyngeal- Mechanical Soft Delayed swallow initiation-vallecula;Pharyngeal residue - valleculae;Reduced pharyngeal peristalsis    04/21/2022  11:39 AM Cervical Esophageal Phase  Cervical Esophageal Phase [facility]  Angela Nevin, MA, CCC-SLP Speech Therapy  Scheduled Meds:  amLODipine  10 mg Per Tube Daily   aspirin  81 mg Per Tube Daily   carvedilol  25 mg Oral BID WC   clopidogrel  75 mg Per Tube Daily    feeding supplement (PROSource TF)  45 mL Per Tube Daily   heparin  5,000 Units Subcutaneous Q8H   insulin aspart  0-6 Units Subcutaneous Q4H   irbesartan  75 mg Oral Daily   multivitamin with minerals  1 tablet Per Tube Daily   mouth rinse  15 mL Mouth Rinse 4 times per day   rosuvastatin  40 mg Per Tube q1800   Continuous Infusions:  feeding supplement (JEVITY 1.2 CAL) 1,000 mL (04/22/22 1247)     LOS: 6 days   Time spent:  Azucena Fallen, DO [facility]  If 7PM-7AM, please contact night-coverage [email or web address]  04/23/2022, 7:40 AM

## 2022-04-23 NOTE — TOC Initial Note (Signed)
Transition of Care [facility]) - Initial/Assessment Note    Patient Details  Name: Alex Grant MRN: [id number] Date of Birth: November 11, 1965  [facility] CM/SW Contact:    Geralynn Ochs, LCSW Phone Number: 04/23/2022, 2:32 PM  Clinical Narrative:        CSW met with patient and family at bedside to discuss SNF placement and provide bed offers. CSW answered family's questions, and checked in with patient's understanding. Patient was able to nod to questions to indicate understanding. Family to review options available, will likely try to go tour over the weekend so they can find the best place for rehab for the patient. CSW to follow.           Expected Discharge Plan: Skilled Nursing Facility Barriers to Discharge: Continued Medical Work up, Ship broker   Patient Goals and CMS Choice Patient states their goals for this hospitalization and ongoing recovery are:: patient unable to participate in goal setting CMS Medicare.gov Compare Post Acute Care list provided to:: Patient Represenative (must comment) Choice offered to / list presented to : Parent  Expected Discharge Plan and Services Expected Discharge Plan: [place] Choice: [place] Living arrangements for the past 2 months: Single Family Home                                      Prior Living Arrangements/Services Living arrangements for the past 2 months: Single Family Home Lives with:: Self Patient language and need for interpreter reviewed:: No Do you feel safe going back to the place where you live?: Yes      Need for Family Participation in Patient Care: Yes (Comment) Care giver support system in place?: No (comment)   Criminal Activity/Legal Involvement Pertinent to Current Situation/Hospitalization: No - Comment as needed  Activities of Daily Living      Permission Sought/Granted Permission sought to share information with :  Facility Sport and exercise psychologist, Family Supports Permission granted to share information with : Yes, Verbal Permission Granted  Share Information with NAME: [place], Laverna Peace  Permission granted to share info w AGENCY: SNF  Permission granted to share info w Relationship: Stepmom, Dad     Emotional Assessment Appearance:: Appears stated age Attitude/Demeanor/Rapport: Unable to Assess Affect (typically observed): Unable to Assess Orientation: : Oriented to Self Alcohol / Substance Use: Not Applicable Psych Involvement: No (comment)  Admission diagnosis:  Dehydration [E86.0] Polycythemia [D75.1] Tachycardia [R00.0] Acute ischemic stroke (HCC) [I63.9] Right thyroid nodule [E04.1] AKI (acute kidney injury) ([place]) [N17.9] Acute CVA (cerebrovascular accident) ([place]) [I63.9] Leukocytosis, unspecified type [D72.829] Patient Active Problem List   Diagnosis Date Noted   Rhabdomyolysis 04/18/2022   HLD (hyperlipidemia) 04/18/2022   Dysphagia 04/18/2022   Acute ischemic stroke ([place]) 04/17/2022   AKI (acute kidney injury) ([place]) 67/89/3810   Metabolic acidosis 17/51/0258   Elevated LFTs 04/17/2022   Hypertension    SIRS (systemic inflammatory response syndrome) (HCC)    Prolonged QT interval    Prediabetes    Leukocytosis    Polycythemia    Right thyroid nodule    Hypertensive urgency 06/27/2014   Chest pain 06/26/2014   PCP:  Patient, No Pcp Per Pharmacy:   [place], [place] [id number] Phone: [phone] Fax: [phone]     Social Determinants of Health (SDOH)  Interventions    Readmission Risk Interventions     No data to display

## 2022-04-24 LAB — GLUCOSE, CAPILLARY
Glucose-Capillary: 121 mg/dL — ABNORMAL HIGH (ref 70–99)
Glucose-Capillary: 124 mg/dL — ABNORMAL HIGH (ref 70–99)
Glucose-Capillary: 170 mg/dL — ABNORMAL HIGH (ref 70–99)
Glucose-Capillary: 225 mg/dL — ABNORMAL HIGH (ref 70–99)
Glucose-Capillary: 157 mg/dL — ABNORMAL HIGH (ref 70–99)

## 2022-04-24 MED ORDER — ADULT MULTIVITAMIN W/MINERALS CH
1.0000 | ORAL_TABLET | Freq: Every day | ORAL | Status: DC
  Administered 2022-04-24 – 2022-04-27 (×4): 1 via ORAL
  Filled 2022-04-24 (×3): qty 1

## 2022-04-24 MED ORDER — AMLODIPINE BESYLATE 10 MG PO TABS
10.0000 mg | ORAL_TABLET | Freq: Every day | ORAL | Status: DC
  Filled 2022-04-24: qty 1

## 2022-04-24 MED ORDER — IRBESARTAN 150 MG PO TABS
75.0000 mg | ORAL_TABLET | Freq: Every day | ORAL | Status: DC
  Administered 2022-04-25 – 2022-04-27 (×3): 75 mg via ORAL
  Filled 2022-04-24 (×3): qty 1

## 2022-04-24 MED ORDER — CLOPIDOGREL BISULFATE 75 MG PO TABS
75.0000 mg | ORAL_TABLET | Freq: Every day | ORAL | Status: DC
  Administered 2022-04-25 – 2022-04-27 (×3): 75 mg via ORAL
  Filled 2022-04-24 (×4): qty 1

## 2022-04-24 MED ORDER — AMLODIPINE BESYLATE 10 MG PO TABS
10.0000 mg | ORAL_TABLET | Freq: Every day | ORAL | Status: DC
  Administered 2022-04-24 – 2022-04-27 (×4): 10 mg via ORAL
  Filled 2022-04-24 (×3): qty 1

## 2022-04-24 MED ORDER — ASPIRIN 81 MG PO CHEW
81.0000 mg | CHEWABLE_TABLET | Freq: Every day | ORAL | Status: DC
  Administered 2022-04-24 – 2022-04-27 (×4): 81 mg via ORAL
  Filled 2022-04-24 (×3): qty 1

## 2022-04-24 MED ORDER — ADULT MULTIVITAMIN W/MINERALS CH
1.0000 | ORAL_TABLET | Freq: Every day | ORAL | Status: DC
  Filled 2022-04-24: qty 1

## 2022-04-24 MED ORDER — ASPIRIN 81 MG PO CHEW
81.0000 mg | CHEWABLE_TABLET | Freq: Every day | ORAL | Status: DC
  Filled 2022-04-24: qty 1

## 2022-04-24 MED ORDER — JEVITY 1.2 CAL PO LIQD: 1000.0000 mL | ORAL | Status: DC

## 2022-04-24 MED ORDER — ROSUVASTATIN CALCIUM 20 MG PO TABS
40.0000 mg | ORAL_TABLET | Freq: Every day | ORAL | Status: DC
  Administered 2022-04-24 – 2022-04-27 (×4): 40 mg via ORAL
  Filled 2022-04-24 (×4): qty 2

## 2022-04-24 MED ORDER — CLOPIDOGREL BISULFATE 75 MG PO TABS
75.0000 mg | ORAL_TABLET | Freq: Once | ORAL | Status: AC
  Administered 2022-04-24: 75 mg via ORAL

## 2022-04-24 NOTE — Progress Notes (Signed)
Physical Therapy Treatment Patient Details Name: Alex Grant MRN: [id number] DOB: 1966-01-19 Today's Date: 04/24/2022   History of Present Illness Pt is a 56 y/o male who presented 04/17/22 after being found down at home with R sided hemiplegia, L gaze deviation, and aphasia. 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 PMH: HTN, prediabetes    PT Comments    Pt progressing towards physical therapy goals. Pt not as engaged with sink level ADL activity. Seemingly avoiding looking in the mirror but would not confirm when directly asked. Motivated to eat/drink this session and set pt up with tray. Mod cues and min assist required for utensil management and finding straw with his mouth. Answered family's questions within scope of practice. Will continue to follow and progress as able per POC.     Recommendations for follow up therapy are one component of a multi-disciplinary discharge planning process, led by the attending physician.  Recommendations may be updated based on patient status, additional functional criteria and insurance authorization.  Follow Up Recommendations  Skilled nursing-short term rehab (<3 hours/day) Can patient physically be transported by private vehicle: No   Assistance Recommended at Discharge Frequent or constant Supervision/Assistance  Patient can return home with the following Two people to help with walking and/or transfers;Two people to help with bathing/dressing/bathroom;Assistance with cooking/housework;Assistance with feeding;Direct supervision/assist for medications management;Direct supervision/assist for financial management;Assist for transportation;Help with stairs or ramp for entrance   Equipment Recommendations  BSC/3in1;Wheelchair (measurements PT);Wheelchair cushion (measurements PT);Hospital bed;Other (comment)    Recommendations for Other Services Rehab consult     Precautions / Restrictions  Precautions Precautions: Fall Restrictions Weight Bearing Restrictions: No     Mobility  Bed Mobility Overal bed mobility: Needs Assistance Bed Mobility: Supine to Sit Rolling: +2 for safety/equipment, Max assist (Rolling L) Sidelying to sit: Mod assist, +2 for physical assistance, +2 for safety/equipment, HOB elevated       General bed mobility comments: Grossly mod assist for transition to EOB. +2 helpful for trunk elevation to full sitting position. Once sitting, pt able to gain and maintain sitting balance with min guard assist.    Transfers Overall transfer level: Needs assistance Equipment used: Ambulation equipment used Transfers: Sit to/from Stand, Bed to chair/wheelchair/BSC Sit to Stand: Min assist, +2 safety/equipment           General transfer comment: Min assist to power up to full stand. Increased cues for upright posture, and assist to correct R lateral lean back to midline. Transfer via Lift Equipment: Stedy  Ambulation/Gait                   Stairs             Wheelchair Mobility    Modified Rankin (Stroke Patients Only) Modified Rankin (Stroke Patients Only) Pre-Morbid Rankin Score: No symptoms Modified Rankin: Severe disability     Balance Overall balance assessment: Needs assistance Sitting-balance support: Feet supported Sitting balance-Leahy Scale: Poor Sitting balance - Comments: Min Guard A - Min A for sitting balance Postural control: Right lateral lean Standing balance support: Single extremity supported, During functional activity, Reliant on assistive device for balance Standing balance-Leahy Scale: Poor Standing balance comment: +2 assist required, min A when in stedy for forward lean and R lateral lean.                            Cognition Arousal/Alertness: Awake/alert Behavior During Therapy: Flat  affect Overall Cognitive Status: Impaired/Different from baseline Area of Impairment: Following commands,  Safety/judgement, Awareness, Problem solving, Attention                   Current Attention Level: Sustained   Following Commands: Follows one step commands consistently, Follows one step commands with increased time, Follows multi-step commands inconsistently Safety/Judgement: Decreased awareness of safety, Decreased awareness of deficits Awareness: Intellectual Problem Solving: Slow processing, Decreased initiation, Difficulty sequencing, Requires verbal cues, Requires tactile cues General Comments: less responsive this session. Will give occasional thumbs up but did not seem real interested in attempting to communicate        Exercises Other Exercises Other Exercises: Multiple sit<>stands with stedy throughout session. Other Exercises: Seated in Stedy at the sink to wash face. Maintained sitting balance ~ 5 minutes total.    General Comments        Pertinent Vitals/Pain Pain Assessment Pain Assessment: No/denies pain Faces Pain Scale: No hurt    Home Living                          Prior Function            PT Goals (current goals can now be found in the care plan section) Acute Rehab PT Goals Patient Stated Goal: did not state PT Goal Formulation: Patient unable to participate in goal setting Time For Goal Achievement: 05/02/22 Potential to Achieve Goals: Fair Progress towards PT goals: Progressing toward goals    Frequency    Min 4X/week      PT Plan Current plan remains appropriate    Co-evaluation              AM-PAC PT "6 Clicks" Mobility   Outcome Measure  Help needed turning from your back to your side while in a flat bed without using bedrails?: A Lot Help needed moving from lying on your back to sitting on the side of a flat bed without using bedrails?: A Lot Help needed moving to and from a bed to a chair (including a wheelchair)?: Total Help needed standing up from a chair using your arms (e.g., wheelchair or bedside  chair)?: A Little Help needed to walk in hospital room?: Total Help needed climbing 3-5 steps with a railing? : Total 6 Click Score: 10    End of Session Equipment Utilized During Treatment: Gait belt Activity Tolerance: Patient tolerated treatment well Patient left: in chair;with call bell/phone within reach;with family/visitor present Nurse Communication: Mobility status;Need for lift equipment (Notified NT pt set up to eat and family assisting) PT Visit Diagnosis: Other abnormalities of gait and mobility (R26.89);Unsteadiness on feet (R26.81);Difficulty in walking, not elsewhere classified (R26.2);Hemiplegia and hemiparesis Hemiplegia - Right/Left: Right Hemiplegia - caused by: Cerebral infarction     Time: 1332-1416 PT Time Calculation (min) (ACUTE ONLY): 44 min  Charges:  $Therapeutic Activity: 38-52 mins                     Conni Slipper, PT, DPT Acute Rehabilitation Services Secure Chat Preferred Office: [phone]    Marylynn Pearson 04/24/2022, 2:27 PM

## 2022-04-24 NOTE — Plan of Care (Signed)
  Problem: Education: Goal: Ability to describe self-care measures that may prevent or decrease complications (Diabetes Survival Skills Education) will improve Outcome: Progressing Goal: Individualized Educational Video(s) Outcome: Progressing   Problem: Coping: Goal: Ability to adjust to condition or change in health will improve Outcome: Progressing   Problem: Fluid Volume: Goal: Ability to maintain a balanced intake and output will improve Outcome: Progressing   Problem: Health Behavior/Discharge Planning: Goal: Ability to identify and utilize available resources and services will improve Outcome: Progressing Goal: Ability to manage health-related needs will improve Outcome: Progressing   Problem: Metabolic: Goal: Ability to maintain appropriate glucose levels will improve Outcome: Progressing   Problem: Nutritional: Goal: Maintenance of adequate nutrition will improve Outcome: Progressing Goal: Progress toward achieving an optimal weight will improve Outcome: Progressing   Problem: Skin Integrity: Goal: Risk for impaired skin integrity will decrease Outcome: Progressing   Problem: Tissue Perfusion: Goal: Adequacy of tissue perfusion will improve Outcome: Progressing   Problem: Education: Goal: Knowledge of General Education information will improve Description: Including pain rating scale, medication(s)/side effects and non-pharmacologic comfort measures Outcome: Progressing   Problem: Health Behavior/Discharge Planning: Goal: Ability to manage health-related needs will improve Outcome: Progressing   Problem: Clinical Measurements: Goal: Ability to maintain clinical measurements within normal limits will improve Outcome: Progressing Goal: Will remain free from infection Outcome: Progressing Goal: Diagnostic test results will improve Outcome: Progressing Goal: Respiratory complications will improve Outcome: Progressing Goal: Cardiovascular complication will  be avoided Outcome: Progressing   Problem: Activity: Goal: Risk for activity intolerance will decrease Outcome: Progressing   Problem: Nutrition: Goal: Adequate nutrition will be maintained Outcome: Progressing   Problem: Coping: Goal: Level of anxiety will decrease Outcome: Progressing   Problem: Elimination: Goal: Will not experience complications related to bowel motility Outcome: Progressing Goal: Will not experience complications related to urinary retention Outcome: Progressing   Problem: Pain Managment: Goal: General experience of comfort will improve Outcome: Progressing   Problem: Safety: Goal: Ability to remain free from injury will improve Outcome: Progressing   Problem: Skin Integrity: Goal: Risk for impaired skin integrity will decrease Outcome: Progressing   Problem: Education: Goal: Knowledge of disease or condition will improve Outcome: Progressing Goal: Knowledge of secondary prevention will improve (SELECT ALL) Outcome: Progressing Goal: Knowledge of patient specific risk factors will improve (INDIVIDUALIZE FOR PATIENT) Outcome: Progressing   Problem: Coping: Goal: Will verbalize positive feelings about self Outcome: Progressing Goal: Will identify appropriate support needs Outcome: Progressing   Problem: Health Behavior/Discharge Planning: Goal: Ability to manage health-related needs will improve Outcome: Progressing   Problem: Self-Care: Goal: Ability to participate in self-care as condition permits will improve Outcome: Progressing Goal: Verbalization of feelings and concerns over difficulty with self-care will improve Outcome: Progressing Goal: Ability to communicate needs accurately will improve Outcome: Progressing   Problem: Nutrition: Goal: Risk of aspiration will decrease Outcome: Progressing Goal: Dietary intake will improve Outcome: Progressing   Problem: Ischemic Stroke/TIA Tissue Perfusion: Goal: Complications of  ischemic stroke/TIA will be minimized Outcome: Progressing   

## 2022-04-24 NOTE — Progress Notes (Signed)
PROGRESS NOTE    Alex Grant  [id number] DOB: Feb 20, 1966 DOA: 04/17/2022 PCP: Patient, No Pcp Per   Brief Narrative:  Alex Grant is a 56 yo male with PMH HTN, HLD, prediabetes who presented via EMS after being found down at home.  Patient was unable to provide any information due to unresponsiveness on admission and all collateral information was obtained from family.  It appears he was last communicated with on Thursday via phone conversation with his dad.  When EMS found him, he was still in bed when found during welfare check on Saturday.  CT head showed acute infarct involving left basal ganglia, no hemorrhage.  MRI brain also performed after admission which also confirmed acute infarct involving left basal ganglia and corona radiata.    Patient continues to work with SLP, PT, hoping for disposition to skilled nursing facility soon for ongoing continued physical therapy and rehabilitation.  Assessment & Plan:   Principal Problem:   Acute ischemic stroke ([place]) Active Problems:   SIRS (systemic inflammatory response syndrome) (HCC)   Dysphagia   AKI (acute kidney injury) ([place])   Rhabdomyolysis   Metabolic acidosis   Elevated LFTs   Hypertension   Prolonged QT interval   Prediabetes   Right thyroid nodule   HLD (hyperlipidemia)  Acute ischemic stroke (HCC) - CT head and MRI brain revealed left basal ganglia and corona radiata stroke - Patient has dense right-sided hemiparesis and appears to have right - sided neglect and mutism -can communicates her left hand thumbs up thumbs down and writing - continue DAPT per neuro (asa for 3 weeks then continue on Plavix only) - Cortrak placement on 6/26 -discontinued 6/30 -tolerating p.o. quite appropriately - statin on hold until LFTs improve further -CMP pending -Tentative plan for 30 day monitor at discharge per neuro rec's -on telemetry in the interim - If no significant improvement in the next few days or any further decline, low  threshold for consulting    Dysphagia - Dysphagia improving somewhat -follow I/O and caloric/free water intake today - potential to DC cortrak later today pending intake.   Hypertensive emergency - Permissive hypertension window closed - Goal systolic blood pressure [id number] - Continues to be moderately well controlled on current regimen - avoid hypotension.  SIRS (systemic inflammatory response syndrome) (HCC) - SEPSIS ruled out - Procalcitonin negative, single febrile event - likely reactive in the setting of large stroke as above as well as profound rhabdomyolysis -Imaging and work-up thus far remain negative -Pneumonia ruled out (remains high risk for aspiration given above dysphagia)   Rhabdomyolysis, resolving - Resolving - increase PO intake as tolerated - CK 3,583 on admission - downtrending appropriately - no longer following - continue to increase p.o. intake as appropriate   AKI (acute kidney injury) ([place]), resolved - baseline creatinine ~ 1 - Likely secondary to profound rhabdo vs ATN from prolonged pre-renal (still at risk) - Renal function downtrending appropriately   HLD (hyperlipidemia) - CHL 317, TG 345, HDL 31, LDL 217 - Will eventually need statin but on hold for now in setting of elevated LFTs   Right thyroid nodule - Incidentally noted on CT on admission - 2.2 cm right thyroid nodule - Outpatient thyroid ultrasound and further work-up   Prediabetes - A1c 5.9% - Diet control once able to initiate tube feeds   Prolonged QT interval - QTtc 519 on EKG - monitor K and Mg     Elevated LFTs - elevated AST/ALT. No signs of obstructive pattern -  suspected from acute illness/rhabdo -Hepatitis panel negative - hold off on statin for CVA until improvement - continue trending    Metabolic acidosis - See AKI and rhabdomyolysis - Continue fluids - Trend BMP  DVT prophylaxis: Heparin Code Status: Full Family Communication: None present -called both father  and stepmother with no answer  Status is: Inpatient  Dispo: The patient is from: Home              Anticipated d/c is to: To be determined              Anticipated d/c date is: 72+ hours              Patient currently not medically stable for discharge  Consultants:  Neurology  Procedures:  None  Antimicrobials:  Discontinued 627  Subjective: No acute issues or events overnight, continue to discuss ongoing therapy with patient, review of systems limited given patient's verbal status.  Objective: Vitals:   04/23/22 1915 04/23/22 2324 04/24/22 0346 04/24/22 0729  BP: 132/66 (!) 124/52 116/69 (!) 169/74  Pulse: 88 77 82 84  Resp: 20  20 17   Temp: 98 F (36.7 C) 97.7 F (36.5 C) 98.2 F (36.8 C) 98.4 F (36.9 C)  TempSrc:  Oral Axillary Axillary  SpO2:  94% 98%   Weight:      Height:        Intake/Output Summary (Last 24 hours) at 04/24/2022 0756 Last data filed at 04/24/2022 0600 Gross per 24 hour  Intake 560 ml  Output 1400 ml  Net -840 ml    Filed Weights   04/21/22 0433 04/22/22 0422 04/23/22 0500  Weight: 85.5 kg 85.3 kg 85.3 kg    Examination:  General:  Pleasantly resting in bed, No acute distress. HEENT: Expressive aphasia without airway compromise, core track NG tube in place Lungs:  Clear to auscultate bilaterally without rhonchi, wheeze, or rales. Heart:  Regular rate and rhythm.  Without murmurs, rubs, or gallops. Abdomen:  Soft, nontender, nondistended.  Without guarding or rebound. Extremities: Profound right-sided weakness 1 out of 5 strength globally Vascular:  Dorsalis pedis and posterior tibial pulses palpable bilaterally. Skin:  Warm and dry, no erythema, no ulcerations.  Data Reviewed: I have personally reviewed following labs and imaging studies  CBC: Recent Labs  Lab 04/17/22 1716 04/17/22 1722 04/19/22 0021 04/20/22 0747 04/21/22 0436 04/22/22 0355 04/23/22 0457  WBC 27.1*   < > 18.7* 12.5* 10.2 13.1* 14.5*  NEUTROABS 23.5*  --   14.9* 9.1* 6.8 8.7*  --   HGB 20.4*   < > 17.0 15.9 16.6 15.8 13.9  HCT 61.1*   < > 52.9* 50.3 52.1* 49.6 44.0  MCV 88.2   < > 91.0 90.8 92.0 92.4 91.1  PLT 493*   < > 338 284 248 223 200   < > = values in this interval not displayed.    Basic Metabolic Panel: Recent Labs  Lab 04/17/22 1716 04/17/22 1722 04/18/22 0354 04/19/22 0021 04/19/22 1657 04/20/22 0747 04/20/22 1817 04/21/22 0436 04/21/22 1754 04/22/22 0355 04/23/22 0457  NA 144 145 146* 149*  --  154*  --   --   --   --  143  K 4.3 4.2 4.0 3.8  --  3.5  --   --   --   --  3.6  CL 110 115* 115* 117*  --  120*  --   --   --   --  111  CO2 17*  --  19* 21*  --  23  --   --   --   --  24  GLUCOSE 172* 171* 141* 130*  --  160*  --   --   --   --  157*  BUN 61* 61* 68* 44*  --  29*  --   --   --   --  31*  CREATININE 3.39* 3.50* 2.95* 1.73*  --  1.39*  --   --   --   --  0.96  CALCIUM 9.9  --  8.8* 8.3*  --  8.7*  --   --   --   --  8.5*  MG 3.1*  --   --  3.1*  --  3.0* 2.9* 2.9* 2.6* 2.5* 2.2  PHOS  --   --   --  4.5 4.1 3.0 2.5  --   --   --   --     GFR: Estimated Creatinine Clearance: 90.3 mL/min (by C-G formula based on SCr of 0.96 mg/dL). Liver Function Tests: Recent Labs  Lab 04/17/22 1716 04/18/22 0354 04/19/22 0021 04/20/22 0747  AST 83* 75* 80* 83*  ALT 55* 47* 54* 69*  ALKPHOS 83 66 71 60  BILITOT 0.8 0.9 0.8 0.7  PROT 8.5* 7.0 6.5 6.4*  ALBUMIN 4.0 3.3* 3.2* 3.1*    No results for input(s): "LIPASE", "AMYLASE" in the last 168 hours. No results for input(s): "AMMONIA" in the last 168 hours. Coagulation Profile: Recent Labs  Lab 04/17/22 1716 04/17/22 1830  INR SPECIMEN/CONTAINER TYPE INAPPROPRIATE FOR ORDERED TEST, UNABLE TO PERFORM 1.2    Cardiac Enzymes: Recent Labs  Lab 04/17/22 1716 04/18/22 0335 04/19/22 0021 04/20/22 0747  CKTOTAL 3,583* 3,327* 2,784* 1,540*    BNP (last 3 results) No results for input(s): "PROBNP" in the last 8760 hours. HbA1C: No results for input(s):  "HGBA1C" in the last 72 hours.  CBG: Recent Labs  Lab 04/23/22 1534 04/23/22 1919 04/23/22 2327 04/24/22 0344 04/24/22 0732  GLUCAP 180* 115* 115* 121* 124*    Lipid Profile: No results for input(s): "CHOL", "HDL", "LDLCALC", "TRIG", "CHOLHDL", "LDLDIRECT" in the last 72 hours.  Thyroid Function Tests: No results for input(s): "TSH", "T4TOTAL", "FREET4", "T3FREE", "THYROIDAB" in the last 72 hours. Anemia Panel: No results for input(s): "VITAMINB12", "FOLATE", "FERRITIN", "TIBC", "IRON", "RETICCTPCT" in the last 72 hours. Sepsis Labs: Recent Labs  Lab 04/18/22 0335 04/18/22 0354 04/19/22 0021 04/20/22 0747  PROCALCITON 0.14  --  <0.10 <0.10  LATICACIDVEN  --  1.3  --   --      Recent Results (from the past 240 hour(s))  SARS Coronavirus 2 by RT PCR (hospital order, performed in [facility] hospital lab) *cepheid single result test* Anterior Nasal Swab     Status: None   Collection Time: 04/17/22  9:20 PM   Specimen: Anterior Nasal Swab  Result Value Ref Range Status   SARS Coronavirus 2 by RT PCR NEGATIVE NEGATIVE Final    Comment: (NOTE) SARS-CoV-2 target nucleic acids are NOT DETECTED.  The SARS-CoV-2 RNA is generally detectable in upper and lower respiratory specimens during the acute phase of infection. The lowest concentration of SARS-CoV-2 viral copies this assay can detect is 250 copies / mL. A negative result does not preclude SARS-CoV-2 infection and should not be used as the sole basis for treatment or other patient management decisions.  A negative result may occur with improper specimen collection / handling, submission of specimen other than nasopharyngeal swab, presence of  viral mutation(s) within the areas targeted by this assay, and inadequate number of viral copies (<250 copies / mL). A negative result must be combined with clinical observations, patient history, and epidemiological information.  Fact Sheet for Patients:    https://www.patel.info/  Fact Sheet for Healthcare Providers: https://hall.com/  This test is not yet approved or  cleared by the [place] FDA and has been authorized for detection and/or diagnosis of SARS-CoV-2 by FDA under an Emergency Use Authorization (EUA).  This EUA will remain in effect (meaning this test can be used) for the duration of the COVID-19 declaration under Section 564(b)(1) of the Act, 21 U.S.C. section 360bbb-3(b)(1), unless the authorization is terminated or revoked sooner.  Performed at [facility], [place] [address] [place], [place] [address]   Urine Culture     Status: None   Collection Time: 04/18/22  2:47 AM   Specimen: In/Out Cath Urine  Result Value Ref Range Status   Specimen Description IN/OUT CATH URINE  Final   Special Requests NONE  Final   Culture   Final    NO GROWTH Performed at [facility], [place] [address] [place], [place] [address]    Report Status 04/19/2022 FINAL  Final  Culture, blood (Routine X 2) w Reflex to ID Panel     Status: None   Collection Time: 04/18/22  3:54 AM   Specimen: BLOOD RIGHT HAND  Result Value Ref Range Status   Specimen Description BLOOD RIGHT HAND  Final   Special Requests   Final    BOTTLES DRAWN AEROBIC AND ANAEROBIC Blood Culture adequate volume   Culture   Final    NO GROWTH 5 DAYS Performed at [facility], [place] [address], [place], [place] [address]    Report Status 04/23/2022 FINAL  Final  Culture, blood (Routine X 2) w Reflex to ID Panel     Status: None   Collection Time: 04/18/22  3:54 AM   Specimen: BLOOD  Result Value Ref Range Status   Specimen Description BLOOD RIGHT ANTECUBITAL  Final   Special Requests   Final    BOTTLES DRAWN AEROBIC AND ANAEROBIC Blood Culture adequate volume   Culture   Final    NO GROWTH 5 DAYS Performed at [facility], [place] [address] [place],  [address]    Report Status 04/23/2022 FINAL   Final         Radiology Studies: No results found.      Scheduled Meds:  amLODipine  10 mg Per Tube Daily   aspirin  81 mg Per Tube Daily   carvedilol  25 mg Oral BID WC   clopidogrel  75 mg Per Tube Daily   feeding supplement (PROSource TF)  45 mL Per Tube Daily   heparin  5,000 Units Subcutaneous Q8H   insulin aspart  0-6 Units Subcutaneous Q4H   irbesartan  75 mg Oral Daily   multivitamin with minerals  1 tablet Per Tube Daily   mouth rinse  15 mL Mouth Rinse 4 times per day   rosuvastatin  40 mg Per Tube q1800   Continuous Infusions:  feeding supplement (JEVITY 1.2 CAL) 1,000 mL (04/22/22 1247)     LOS: 7 days   Time spent: 51min  Ellicia Alix C Yuki Purves, DO [facility]  If 7PM-7AM, please contact night-coverage [email or web address]  04/24/2022, 7:56 AM

## 2022-04-25 LAB — COMPREHENSIVE METABOLIC PANEL
ALT: 66 U/L — ABNORMAL HIGH (ref 0–44)
AST: 44 U/L — ABNORMAL HIGH (ref 15–41)
Albumin: 2.6 g/dL — ABNORMAL LOW (ref 3.5–5.0)
Alkaline Phosphatase: 47 U/L (ref 38–126)
Anion gap: 8 (ref 5–15)
BUN: 24 mg/dL — ABNORMAL HIGH (ref 6–20)
CO2: 24 mmol/L (ref 22–32)
Calcium: 8.3 mg/dL — ABNORMAL LOW (ref 8.9–10.3)
Chloride: 108 mmol/L (ref 98–111)
Creatinine, Ser: 0.98 mg/dL (ref 0.61–1.24)
GFR, Estimated: >60 mL/min (ref >60)
Glucose, Bld: 127 mg/dL — ABNORMAL HIGH (ref 70–99)
Potassium: 3.8 mmol/L (ref 3.5–5.1)
Sodium: 140 mmol/L (ref 135–145)
Total Bilirubin: 1 mg/dL (ref 0.3–1.2)
Total Protein: 5.8 g/dL — ABNORMAL LOW (ref 6.5–8.1)

## 2022-04-25 LAB — GLUCOSE, CAPILLARY
Glucose-Capillary: 130 mg/dL — ABNORMAL HIGH (ref 70–99)
Glucose-Capillary: 135 mg/dL — ABNORMAL HIGH (ref 70–99)
Glucose-Capillary: 153 mg/dL — ABNORMAL HIGH (ref 70–99)
Glucose-Capillary: 158 mg/dL — ABNORMAL HIGH (ref 70–99)
Glucose-Capillary: 177 mg/dL — ABNORMAL HIGH (ref 70–99)
Glucose-Capillary: 203 mg/dL — ABNORMAL HIGH (ref 70–99)

## 2022-04-25 MED ORDER — INSULIN ASPART 100 UNIT/ML IJ SOLN: 0.0000 [IU] | Freq: Three times a day (TID) | INTRAMUSCULAR | Status: DC

## 2022-04-25 NOTE — Progress Notes (Signed)
SW spoke with pt's stepmother and pt's brother re SNF choice. Pt currently has offers from [facility] and [facility]. Pt's stepmother refusing [facility] but not willing to make a decision on [facility] until there is a response from [facility].   Pt's brother reports they have another family member at [facility] and this facility is their preference. Weekday SW to f/u with [facility] and update pt's family.   Dellie Burns, MSW, LCSW [phone] (coverage)

## 2022-04-25 NOTE — Progress Notes (Signed)
Physical Therapy Treatment Patient Details Name: Alex Grant MRN: [id number] DOB: [birth year] Today's Date: 04/25/2022   History of Present Illness Pt is a 56 y/o male who presented 04/17/22 after being found down at home with R sided hemiplegia, L gaze deviation, and aphasia. 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 PMH: HTN, prediabetes    PT Comments    Pt progressing towards physical therapy goals. Was able to perform transfers with +1 assist this session and Stedy, however +2 still helpful for ease of transfer and [facility] management. Educated family on positioning of the RUE and to float heels when in bed or in chair. Will continue to follow and progress as able per POC.     Recommendations for follow up therapy are one component of a multi-disciplinary discharge planning process, led by the attending physician.  Recommendations may be updated based on patient status, additional functional criteria and insurance authorization.  Follow Up Recommendations  Skilled nursing-short term rehab (<3 hours/day) Can patient physically be transported by private vehicle: No   Assistance Recommended at Discharge Frequent or constant Supervision/Assistance  Patient can return home with the following Two people to help with walking and/or transfers;Two people to help with bathing/dressing/bathroom;Assistance with cooking/housework;Assistance with feeding;Direct supervision/assist for medications management;Direct supervision/assist for financial management;Assist for transportation;Help with stairs or ramp for entrance   Equipment Recommendations  BSC/3in1;Wheelchair (measurements PT);Wheelchair cushion (measurements PT);Hospital bed;Other (comment)    Recommendations for Other Services Rehab consult     Precautions / Restrictions Precautions Precautions: Fall Restrictions Weight Bearing Restrictions: No     Mobility  Bed Mobility Overal bed mobility:  Needs Assistance Bed Mobility: Supine to Sit Rolling: Max assist (Rolling L) Sidelying to sit: Mod assist, HOB elevated       General bed mobility comments: Assist for rolling with bed pad and for trunk elevation to full sitting position. Once sitting, pt able to gain and maintain sitting balance with min guard assist.    Transfers Overall transfer level: Needs assistance Equipment used: Ambulation equipment used Transfers: Sit to/from Stand, Bed to chair/wheelchair/BSC Sit to Stand: Mod assist           General transfer comment: Increased assist to power up to full stand with 1 person assist,o to gain and maintain standing balance. Increased cues for upright posture, and assist to correct R lateral lean back to midline. Transfer via Lift Equipment: Stedy  Ambulation/Gait                   Stairs             Wheelchair Mobility    Modified Rankin (Stroke Patients Only) Modified Rankin (Stroke Patients Only) Pre-Morbid Rankin Score: No symptoms Modified Rankin: Severe disability     Balance Overall balance assessment: Needs assistance Sitting-balance support: Feet supported Sitting balance-Leahy Scale: Poor Sitting balance - Comments: Min Guard A - Min A for sitting balance Postural control: Right lateral lean Standing balance support: Single extremity supported, During functional activity, Reliant on assistive device for balance Standing balance-Leahy Scale: Poor                              Cognition Arousal/Alertness: Awake/alert Behavior During Therapy: Flat affect Overall Cognitive Status: Impaired/Different from baseline Area of Impairment: Following commands, Safety/judgement, Awareness, Problem solving, Attention  Current Attention Level: Sustained   Following Commands: Follows one step commands consistently, Follows one step commands with increased time, Follows multi-step commands  inconsistently Safety/Judgement: Decreased awareness of safety, Decreased awareness of deficits Awareness: Intellectual Problem Solving: Slow processing, Decreased initiation, Difficulty sequencing, Requires verbal cues, Requires tactile cues          Exercises Other Exercises Other Exercises: Multiple sit<>stands with stedy throughout session. Other Exercises: R LE stretching/ROM due to increased tone.    General Comments        Pertinent Vitals/Pain Pain Assessment Pain Assessment: No/denies pain Faces Pain Scale: No hurt    Home Living                          Prior Function            PT Goals (current goals can now be found in the care plan section) Acute Rehab PT Goals Patient Stated Goal: did not state PT Goal Formulation: Patient unable to participate in goal setting Time For Goal Achievement: 05/02/22 Potential to Achieve Goals: Fair Progress towards PT goals: Progressing toward goals    Frequency    Min 4X/week      PT Plan Current plan remains appropriate    Co-evaluation              AM-PAC PT "6 Clicks" Mobility   Outcome Measure  Help needed turning from your back to your side while in a flat bed without using bedrails?: A Lot Help needed moving from lying on your back to sitting on the side of a flat bed without using bedrails?: A Lot Help needed moving to and from a bed to a chair (including a wheelchair)?: Total Help needed standing up from a chair using your arms (e.g., wheelchair or bedside chair)?: A Little Help needed to walk in hospital room?: Total Help needed climbing 3-5 steps with a railing? : Total 6 Click Score: 10    End of Session Equipment Utilized During Treatment: Gait belt Activity Tolerance: Patient tolerated treatment well Patient left: in chair;with call bell/phone within reach;with family/visitor present Nurse Communication: Mobility status;Need for lift equipment PT Visit Diagnosis: Other  abnormalities of gait and mobility (R26.89);Unsteadiness on feet (R26.81);Difficulty in walking, not elsewhere classified (R26.2);Hemiplegia and hemiparesis Hemiplegia - Right/Left: Right Hemiplegia - caused by: Cerebral infarction     Time: 2536-6440 PT Time Calculation (min) (ACUTE ONLY): 31 min  Charges:  $Therapeutic Activity: 23-37 mins                     Alex Grant, PT, DPT Acute Rehabilitation Services Secure Chat Preferred Office: [phone]    Alex Grant 04/25/2022, 3:17 PM

## 2022-04-25 NOTE — Progress Notes (Signed)
PROGRESS NOTE    Alex Grant  EPP:[id number] DOB: September 22, 1966 DOA: 04/17/2022 PCP: Patient, No Pcp Per   Brief Narrative:  Alex Grant is a 56 yo male with PMH HTN, HLD, prediabetes who presented via EMS after being found down at home.  Patient was unable to provide any information due to unresponsiveness on admission and all collateral information was obtained from family.  It appears he was last communicated with on Thursday via phone conversation with his dad.  When EMS found him, he was still in bed when found during welfare check on Saturday.  CT head showed acute infarct involving left basal ganglia, no hemorrhage.  MRI brain also performed after admission which also confirmed acute infarct involving left basal ganglia and corona radiata.    Patient continues to work with SLP, PT, hoping for disposition to skilled nursing facility soon for ongoing continued physical therapy and rehabilitation.  Assessment & Plan:   Principal Problem:   Acute ischemic stroke (HCC) Active Problems:   SIRS (systemic inflammatory response syndrome) (HCC)   Dysphagia   AKI (acute kidney injury) (HCC)   Rhabdomyolysis   Metabolic acidosis   Elevated LFTs   Hypertension   Prolonged QT interval   Prediabetes   Right thyroid nodule   HLD (hyperlipidemia)  Acute ischemic stroke (HCC) - CT head and MRI brain revealed left basal ganglia and corona radiata stroke - Patient has dense right-sided hemiparesis and appears to have right - sided neglect and mutism -can communicates her left hand thumbs up thumbs down and writing - continue DAPT per neuro (asa for 3 weeks then continue on Plavix only) - Cortrak placement on 6/26 -discontinued 6/30 -tolerating p.o. quite appropriately - statin on hold until LFTs improve further -CMP pending -Tentative plan for 30 day monitor at discharge per neuro rec's -on telemetry in the interim - If no significant improvement in the next few days or any further decline, low  threshold for consulting    Dysphagia, improving - Dysphagia improving Cortrak discontinued, continue to advance diet as tolerated per SLP.   Hypertensive emergency - Permissive hypertension window closed - Goal systolic blood pressure 130-140 - Continues to be moderately well controlled on current regimen - avoid hypotension.  SIRS (systemic inflammatory response syndrome) (HCC) - SEPSIS ruled out - Procalcitonin negative, single febrile event - likely reactive in the setting of large stroke as above as well as profound rhabdomyolysis -Imaging and work-up thus far remain negative -Pneumonia ruled out (remains high risk for aspiration given above dysphagia)   Rhabdomyolysis, resolving - Resolving - increase PO intake as tolerated - CK 3,583 on admission - downtrending appropriately - no longer following - continue to increase p.o. intake as appropriate   AKI (acute kidney injury) (HCC), resolved - baseline creatinine ~ 1 - Likely secondary to profound rhabdo vs ATN from prolonged pre-renal (still at risk) - Renal function downtrending appropriately   HLD (hyperlipidemia) - CHL 317, TG 345, HDL 31, LDL 217 - Will eventually need statin but on hold for now in setting of elevated LFTs   Right thyroid nodule - Incidentally noted on CT on admission - 2.2 cm right thyroid nodule - Outpatient thyroid ultrasound and further work-up   Prediabetes - A1c 5.9% - Diet control once able to initiate tube feeds   Prolonged QT interval - QTtc 519 on EKG - monitor K and Mg     Elevated LFTs - elevated AST/ALT. No signs of obstructive pattern - suspected from acute illness/rhabdo -Hepatitis  panel negative - hold off on statin for CVA until improvement - continue trending    Metabolic acidosis - See AKI and rhabdomyolysis - Continue fluids - Trend BMP  DVT prophylaxis: Heparin Code Status: Full Family Communication: None present -called without answer  Status is:  Inpatient  Dispo: The patient is from: Home              Anticipated d/c is to: To be determined              Anticipated d/c date is: 72+ hours              Patient currently not medically stable for discharge  Consultants:  Neurology  Procedures:  None  Antimicrobials:  Discontinued 627  Subjective: No acute issues or events overnight  Objective: Vitals:   04/24/22 1933 04/25/22 0021 04/25/22 0418 04/25/22 0500  BP: 135/61 122/66 (!) 152/69   Pulse: 83 79 87   Resp:  20 20   Temp: 98.9 F (37.2 C) 99.4 F (37.4 C) 98.9 F (37.2 C)   TempSrc: Oral Oral Oral   SpO2: 96% 95% 98%   Weight:    85.7 kg  Height:        Intake/Output Summary (Last 24 hours) at 04/25/2022 0741 Last data filed at 04/25/2022 8299 Gross per 24 hour  Intake 840 ml  Output 350 ml  Net 490 ml    Filed Weights   04/22/22 0422 04/23/22 0500 04/25/22 0500  Weight: 85.3 kg 85.3 kg 85.7 kg    Examination:  General:  Pleasantly resting in bed, No acute distress. HEENT: Expressive aphasia without airway compromise, core track NG tube in place Lungs:  Clear to auscultate bilaterally without rhonchi, wheeze, or rales. Heart:  Regular rate and rhythm.  Without murmurs, rubs, or gallops. Abdomen:  Soft, nontender, nondistended.  Without guarding or rebound. Extremities: Profound right-sided weakness 1 out of 5 strength globally Vascular:  Dorsalis pedis and posterior tibial pulses palpable bilaterally. Skin:  Warm and dry, no erythema, no ulcerations.  Data Reviewed: I have personally reviewed following labs and imaging studies  CBC: Recent Labs  Lab 04/19/22 0021 04/20/22 0747 04/21/22 0436 04/22/22 0355 04/23/22 0457  WBC 18.7* 12.5* 10.2 13.1* 14.5*  NEUTROABS 14.9* 9.1* 6.8 8.7*  --   HGB 17.0 15.9 16.6 15.8 13.9  HCT 52.9* 50.3 52.1* 49.6 44.0  MCV 91.0 90.8 92.0 92.4 91.1  PLT 338 284 248 223 200    Basic Metabolic Panel: Recent Labs  Lab 04/19/22 0021 04/19/22 1657  04/20/22 0747 04/20/22 1817 04/21/22 0436 04/21/22 1754 04/22/22 0355 04/23/22 0457  NA 149*  --  154*  --   --   --   --  143  K 3.8  --  3.5  --   --   --   --  3.6  CL 117*  --  120*  --   --   --   --  111  CO2 21*  --  23  --   --   --   --  24  GLUCOSE 130*  --  160*  --   --   --   --  157*  BUN 44*  --  29*  --   --   --   --  31*  CREATININE 1.73*  --  1.39*  --   --   --   --  0.96  CALCIUM 8.3*  --  8.7*  --   --   --   --  8.5*  MG 3.1*  --  3.0* 2.9* 2.9* 2.6* 2.5* 2.2  PHOS 4.5 4.1 3.0 2.5  --   --   --   --     GFR: Estimated Creatinine Clearance: 90.4 mL/min (by C-G formula based on SCr of 0.96 mg/dL). Liver Function Tests: Recent Labs  Lab 04/19/22 0021 04/20/22 0747  AST 80* 83*  ALT 54* 69*  ALKPHOS 71 60  BILITOT 0.8 0.7  PROT 6.5 6.4*  ALBUMIN 3.2* 3.1*    No results for input(s): "LIPASE", "AMYLASE" in the last 168 hours. No results for input(s): "AMMONIA" in the last 168 hours. Coagulation Profile: No results for input(s): "INR", "PROTIME" in the last 168 hours.  Cardiac Enzymes: Recent Labs  Lab 04/19/22 0021 04/20/22 0747  CKTOTAL 2,784* 1,540*    BNP (last 3 results) No results for input(s): "PROBNP" in the last 8760 hours. HbA1C: No results for input(s): "HGBA1C" in the last 72 hours.  CBG: Recent Labs  Lab 04/24/22 1102 04/24/22 1541 04/24/22 1936 04/25/22 0024 04/25/22 0422  GLUCAP 170* 225* 157* 135* 158*    Lipid Profile: No results for input(s): "CHOL", "HDL", "LDLCALC", "TRIG", "CHOLHDL", "LDLDIRECT" in the last 72 hours.  Thyroid Function Tests: No results for input(s): "TSH", "T4TOTAL", "FREET4", "T3FREE", "THYROIDAB" in the last 72 hours. Anemia Panel: No results for input(s): "VITAMINB12", "FOLATE", "FERRITIN", "TIBC", "IRON", "RETICCTPCT" in the last 72 hours. Sepsis Labs: Recent Labs  Lab 04/19/22 0021 04/20/22 0747  PROCALCITON <0.10 <0.10     Recent Results (from the past 240 hour(s))  SARS  Coronavirus 2 by RT PCR (hospital order, performed in [facility] hospital lab) *cepheid single result test* Anterior Nasal Swab     Status: None   Collection Time: 04/17/22  9:20 PM   Specimen: Anterior Nasal Swab  Result Value Ref Range Status   SARS Coronavirus 2 by RT PCR NEGATIVE NEGATIVE Final    Comment: (NOTE) SARS-CoV-2 target nucleic acids are NOT DETECTED.  The SARS-CoV-2 RNA is generally detectable in upper and lower respiratory specimens during the acute phase of infection. The lowest concentration of SARS-CoV-2 viral copies this assay can detect is 250 copies / mL. A negative result does not preclude SARS-CoV-2 infection and should not be used as the sole basis for treatment or other patient management decisions.  A negative result may occur with improper specimen collection / handling, submission of specimen other than nasopharyngeal swab, presence of viral mutation(s) within the areas targeted by this assay, and inadequate number of viral copies (<250 copies / mL). A negative result must be combined with clinical observations, patient history, and epidemiological information.  Fact Sheet for Patients:   RoadLapTop.co.za  Fact Sheet for Healthcare Providers: http://kim-miller.com/  This test is not yet approved or  cleared by the [place] FDA and has been authorized for detection and/or diagnosis of SARS-CoV-2 by FDA under an Emergency Use Authorization (EUA).  This EUA will remain in effect (meaning this test can be used) for the duration of the COVID-19 declaration under Section 564(b)(1) of the Act, 21 U.S.C. section 360bbb-3(b)(1), unless the authorization is terminated or revoked sooner.  Performed at [facility], [address] [place], [place] [address]   Urine Culture     Status: None   Collection Time: 04/18/22  2:47 AM   Specimen: In/Out Cath Urine  Result Value Ref Range Status   Specimen  Description IN/OUT CATH URINE  Final   Special Requests NONE  Final  Culture   Final    NO GROWTH Performed at [facility], [address], [place], [place] [address]    Report Status 04/19/2022 FINAL  Final  Culture, blood (Routine X 2) w Reflex to ID Panel     Status: None   Collection Time: 04/18/22  3:54 AM   Specimen: BLOOD RIGHT HAND  Result Value Ref Range Status   Specimen Description BLOOD RIGHT HAND  Final   Special Requests   Final    BOTTLES DRAWN AEROBIC AND ANAEROBIC Blood Culture adequate volume   Culture   Final    NO GROWTH 5 DAYS Performed at [facility], [address] [place], [place] [address]    Report Status 04/23/2022 FINAL  Final  Culture, blood (Routine X 2) w Reflex to ID Panel     Status: None   Collection Time: 04/18/22  3:54 AM   Specimen: BLOOD  Result Value Ref Range Status   Specimen Description BLOOD RIGHT ANTECUBITAL  Final   Special Requests   Final    BOTTLES DRAWN AEROBIC AND ANAEROBIC Blood Culture adequate volume   Culture   Final    NO GROWTH 5 DAYS Performed at [facility], [address], [place], [place] [address]    Report Status 04/23/2022 FINAL  Final         Radiology Studies: No results found.      Scheduled Meds:  amLODipine  10 mg Oral Daily   aspirin  81 mg Oral Daily   carvedilol  25 mg Oral BID WC   clopidogrel  75 mg Oral Daily   heparin  5,000 Units Subcutaneous Q8H   insulin aspart  0-6 Units Subcutaneous Q4H   irbesartan  75 mg Oral Daily   multivitamin with minerals  1 tablet Oral Daily   mouth rinse  15 mL Mouth Rinse 4 times per day   rosuvastatin  40 mg Oral q1800   Continuous Infusions:     LOS: 8 days   Time spent:  Azucena Fallen, DO [facility]  If 7PM-7AM, please contact night-coverage [email or web address]  04/25/2022, 7:41 AM

## 2022-04-25 NOTE — Plan of Care (Signed)
  Problem: Education: Goal: Ability to describe self-care measures that may prevent or decrease complications (Diabetes Survival Skills Education) will improve Outcome: Progressing Goal: Individualized Educational Video(s) Outcome: Progressing   Problem: Coping: Goal: Ability to adjust to condition or change in health will improve Outcome: Progressing   Problem: Fluid Volume: Goal: Ability to maintain a balanced intake and output will improve Outcome: Progressing   Problem: Health Behavior/Discharge Planning: Goal: Ability to identify and utilize available resources and services will improve Outcome: Progressing Goal: Ability to manage health-related needs will improve Outcome: Progressing   Problem: Metabolic: Goal: Ability to maintain appropriate glucose levels will improve Outcome: Progressing   Problem: Nutritional: Goal: Maintenance of adequate nutrition will improve Outcome: Progressing Goal: Progress toward achieving an optimal weight will improve Outcome: Progressing   Problem: Skin Integrity: Goal: Risk for impaired skin integrity will decrease Outcome: Progressing   Problem: Tissue Perfusion: Goal: Adequacy of tissue perfusion will improve Outcome: Progressing   Problem: Education: Goal: Knowledge of General Education information will improve Description: Including pain rating scale, medication(s)/side effects and non-pharmacologic comfort measures Outcome: Progressing   Problem: Health Behavior/Discharge Planning: Goal: Ability to manage health-related needs will improve Outcome: Progressing   Problem: Clinical Measurements: Goal: Ability to maintain clinical measurements within normal limits will improve Outcome: Progressing Goal: Will remain free from infection Outcome: Progressing Goal: Diagnostic test results will improve Outcome: Progressing Goal: Respiratory complications will improve Outcome: Progressing Goal: Cardiovascular complication will  be avoided Outcome: Progressing   Problem: Activity: Goal: Risk for activity intolerance will decrease Outcome: Progressing   Problem: Nutrition: Goal: Adequate nutrition will be maintained Outcome: Progressing   Problem: Coping: Goal: Level of anxiety will decrease Outcome: Progressing   Problem: Elimination: Goal: Will not experience complications related to bowel motility Outcome: Progressing Goal: Will not experience complications related to urinary retention Outcome: Progressing   Problem: Pain Managment: Goal: General experience of comfort will improve Outcome: Progressing   Problem: Safety: Goal: Ability to remain free from injury will improve Outcome: Progressing   Problem: Skin Integrity: Goal: Risk for impaired skin integrity will decrease Outcome: Progressing   Problem: Education: Goal: Knowledge of disease or condition will improve Outcome: Progressing Goal: Knowledge of secondary prevention will improve (SELECT ALL) Outcome: Progressing Goal: Knowledge of patient specific risk factors will improve (INDIVIDUALIZE FOR PATIENT) Outcome: Progressing   Problem: Coping: Goal: Will verbalize positive feelings about self Outcome: Progressing Goal: Will identify appropriate support needs Outcome: Progressing   Problem: Health Behavior/Discharge Planning: Goal: Ability to manage health-related needs will improve Outcome: Progressing   Problem: Self-Care: Goal: Ability to participate in self-care as condition permits will improve Outcome: Progressing Goal: Verbalization of feelings and concerns over difficulty with self-care will improve Outcome: Progressing Goal: Ability to communicate needs accurately will improve Outcome: Progressing   Problem: Nutrition: Goal: Risk of aspiration will decrease Outcome: Progressing Goal: Dietary intake will improve Outcome: Progressing   Problem: Ischemic Stroke/TIA Tissue Perfusion: Goal: Complications of  ischemic stroke/TIA will be minimized Outcome: Progressing   

## 2022-04-26 LAB — GLUCOSE, CAPILLARY
Glucose-Capillary: 123 mg/dL — ABNORMAL HIGH (ref 70–99)
Glucose-Capillary: 161 mg/dL — ABNORMAL HIGH (ref 70–99)
Glucose-Capillary: 109 mg/dL — ABNORMAL HIGH (ref 70–99)
Glucose-Capillary: 144 mg/dL — ABNORMAL HIGH (ref 70–99)

## 2022-04-26 MED ORDER — INSULIN ASPART 100 UNIT/ML IJ SOLN: 0.0000 [IU] | Freq: Three times a day (TID) | INTRAMUSCULAR | Status: DC

## 2022-04-26 MED ORDER — INSULIN ASPART 100 UNIT/ML IJ SOLN
0.0000 [IU] | Freq: Three times a day (TID) | INTRAMUSCULAR | Status: DC
  Administered 2022-04-26 – 2022-04-27 (×2): 1 [IU] via SUBCUTANEOUS
  Administered 2022-04-27: 2 [IU] via SUBCUTANEOUS

## 2022-04-26 NOTE — Plan of Care (Signed)
  Problem: Education: Goal: Ability to describe self-care measures that may prevent or decrease complications (Diabetes Survival Skills Education) will improve Outcome: Progressing   Problem: Coping: Goal: Ability to adjust to condition or change in health will improve Outcome: Progressing   Problem: Fluid Volume: Goal: Ability to maintain a balanced intake and output will improve Outcome: Progressing   Problem: Nutritional: Goal: Maintenance of adequate nutrition will improve Outcome: Progressing Goal: Progress toward achieving an optimal weight will improve Outcome: Progressing   Problem: Skin Integrity: Goal: Risk for impaired skin integrity will decrease Outcome: Progressing   Problem: Tissue Perfusion: Goal: Adequacy of tissue perfusion will improve Outcome: Progressing   Problem: Health Behavior/Discharge Planning: Goal: Ability to manage health-related needs will improve Outcome: Progressing   Problem: Clinical Measurements: Goal: Ability to maintain clinical measurements within normal limits will improve Outcome: Progressing Goal: Will remain free from infection Outcome: Progressing Goal: Diagnostic test results will improve Outcome: Progressing Goal: Respiratory complications will improve Outcome: Progressing Goal: Cardiovascular complication will be avoided Outcome: Progressing   Problem: Clinical Measurements: Goal: Will remain free from infection Outcome: Progressing   Problem: Clinical Measurements: Goal: Diagnostic test results will improve Outcome: Progressing   Problem: Activity: Goal: Risk for activity intolerance will decrease Outcome: Progressing   Problem: Coping: Goal: Level of anxiety will decrease Outcome: Progressing   Problem: Pain Managment: Goal: General experience of comfort will improve Outcome: Progressing   Problem: Safety: Goal: Ability to remain free from injury will improve Outcome: Progressing   Problem: Skin  Integrity: Goal: Risk for impaired skin integrity will decrease Outcome: Progressing   Problem: Education: Goal: Knowledge of disease or condition will improve Outcome: Progressing Goal: Knowledge of secondary prevention will improve (SELECT ALL) Outcome: Progressing Goal: Knowledge of patient specific risk factors will improve (INDIVIDUALIZE FOR PATIENT) Outcome: Progressing   Problem: Coping: Goal: Will verbalize positive feelings about self Outcome: Progressing Goal: Will identify appropriate support needs Outcome: Progressing

## 2022-04-26 NOTE — Progress Notes (Signed)
Occupational Therapy Treatment Patient Details Name: Alex Grant MRN: [id number] DOB: Jan 14, 1966 Today's Date: 04/26/2022   History of present illness Pt is a 56 y/o male who presented 04/17/22 after being found down at home with R sided hemiplegia, L gaze deviation, and aphasia. 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 PMH: HTN, prediabetes   OT comments  Pt making slow progress towards goals, completes sit to stand transfer with mod A +2, facilitated RUE WB at sink counter while pt performed standing grooming and UB bathing tasks. Pt inconsistently able to respond to yes/no questions with thumbs up/thumbs down gesture. Educated family on supportive positioning for RUE along with encouraging involvement of RUE during ADL tasks, such as bathing, applying lotion, and using LUE to assist with movement of RUE. Pt presenting with impairments listed below, will follow acutely. Recommend SNF at d/c.   Recommendations for follow up therapy are one component of a multi-disciplinary discharge planning process, led by the attending physician.  Recommendations may be updated based on patient status, additional functional criteria and insurance authorization.    Follow Up Recommendations  Skilled nursing-short term rehab (<3 hours/day)    Assistance Recommended at Discharge Frequent or constant Supervision/Assistance  Patient can return home with the following  Two people to help with walking and/or transfers;Two people to help with bathing/dressing/bathroom;Assistance with cooking/housework;Assistance with feeding;Direct supervision/assist for medications management;Direct supervision/assist for financial management;Assist for transportation;Help with stairs or ramp for entrance   Equipment Recommendations  BSC/3in1;Tub/shower seat;Wheelchair (measurements OT);Wheelchair cushion (measurements OT)    Recommendations for Other Services      Precautions /  Restrictions Precautions Precautions: Fall Restrictions Weight Bearing Restrictions: No       Mobility Bed Mobility Overal bed mobility: Needs Assistance Bed Mobility: Supine to Sit     Supine to sit: Mod assist, +2 for physical assistance, HOB elevated     General bed mobility comments: Bed pad and +2 assist utilized for transition around to EOB.    Transfers Overall transfer level: Needs assistance Equipment used: 2 person hand held assist Transfers: Sit to/from Stand Sit to Stand: Mod assist, +2 safety/equipment, From elevated surface           General transfer comment: +2 present for power up to full standing position. Once in standing, pt's UE's were placed over therapists' shoulders for "3 musketeer" assist in preparation to ambulate.     Balance Overall balance assessment: Needs assistance Sitting-balance support: Feet supported Sitting balance-Leahy Scale: Poor   Postural control: Right lateral lean Standing balance support: Single extremity supported, During functional activity, Reliant on assistive device for balance Standing balance-Leahy Scale: Poor Standing balance comment: dependent on external support                           ADL either performed or assessed with clinical judgement   ADL Overall ADL's : Needs assistance/impaired     Grooming: Wash/dry hands;Standing;Moderate assistance   Upper Body Bathing: Standing;Moderate assistance Upper Body Bathing Details (indicate cue type and reason): bathing RUE with LUE             Toilet Transfer: Moderate assistance;+2 for physical assistance Toilet Transfer Details (indicate cue type and reason): simulated to chair Toileting- Clothing Manipulation and Hygiene: Maximal assistance;+2 for physical assistance;Sit to/from stand       Functional mobility during ADLs: Moderate assistance;+2 for physical assistance      Extremity/Trunk Assessment Upper Extremity  Assessment Upper  Extremity Assessment: RUE deficits/detail RUE Deficits / Details: PROM WFL, mild tone noted with elbow extension RUE Coordination: decreased fine motor;decreased gross motor   Lower Extremity Assessment Lower Extremity Assessment: Defer to PT evaluation        Vision   Vision Assessment?: Vision impaired- to be further tested in functional context Additional Comments: will further assess, functional for sinkside ADLs   Perception Perception Perception: Not tested   Praxis Praxis Praxis: Not tested    Cognition Arousal/Alertness: Awake/alert Behavior During Therapy: Flat affect Overall Cognitive Status: Impaired/Different from baseline Area of Impairment: Following commands, Safety/judgement, Awareness, Problem solving, Attention                   Current Attention Level: Sustained   Following Commands: Follows one step commands consistently, Follows one step commands with increased time, Follows multi-step commands inconsistently Safety/Judgement: Decreased awareness of safety, Decreased awareness of deficits Awareness: Intellectual Problem Solving: Slow processing, Decreased initiation, Difficulty sequencing, Requires verbal cues, Requires tactile cues General Comments: Decreased communication this session. Staring at times but decreased pointing, thumbs up/down, and expressing needs.        Exercises      Shoulder Instructions       General Comments VSS on RA    Pertinent Vitals/ Pain       Pain Assessment Pain Assessment: Faces Pain Score: 0-No pain Pain Intervention(s): Limited activity within patient's tolerance  Home Living                                          Prior Functioning/Environment              Frequency  Min 2X/week        Progress Toward Goals  OT Goals(current goals can now be found in the care plan section)  Progress towards OT goals: Progressing toward goals  Acute Rehab OT Goals Patient Stated  Goal: pt unable to state OT Goal Formulation: Patient unable to participate in goal setting Time For Goal Achievement: 05/02/22 Potential to Achieve Goals: Good ADL Goals Pt Will Perform Eating: with min assist;with adaptive utensils;sitting Pt Will Perform Grooming: with min assist;with adaptive equipment;sitting Pt Will Transfer to Toilet: with mod assist;stand pivot transfer Pt/caregiver will Perform Home Exercise Program: Increased ROM;Increased strength;Right Upper extremity;With minimal assist;With written HEP provided Additional ADL Goal #1: Pt will follow 2 step commands 75% of each therapy session. Additional ADL Goal #2: Pt will complete bed mobility with mod A as a precursor to seated ADL's.  Plan Discharge plan remains appropriate    Co-evaluation    PT/OT/SLP Co-Evaluation/Treatment: Yes Reason for Co-Treatment: Necessary to address cognition/behavior during functional activity;To address functional/ADL transfers PT goals addressed during session: Mobility/safety with mobility OT goals addressed during session: ADL's and self-care      AM-PAC OT "6 Clicks" Daily Activity     Outcome Measure   Help from another person eating meals?: Total Help from another person taking care of personal grooming?: A Lot Help from another person toileting, which includes using toliet, bedpan, or urinal?: A Lot Help from another person bathing (including washing, rinsing, drying)?: A Lot Help from another person to put on and taking off regular upper body clothing?: A Lot Help from another person to put on and taking off regular lower body clothing?: Total 6 Click Score: 10    End of  Session Equipment Utilized During Treatment: Gait belt  OT Visit Diagnosis: Unsteadiness on feet (R26.81);Other abnormalities of gait and mobility (R26.89);Muscle weakness (generalized) (M62.81);Hemiplegia and hemiparesis Hemiplegia - Right/Left: Right Hemiplegia - dominant/non-dominant:  Dominant Hemiplegia - caused by: Cerebral infarction   Activity Tolerance Patient tolerated treatment well   Patient Left with call bell/phone within reach;in chair;with chair alarm set   Nurse Communication Mobility status        Time: 9924-2683 OT Time Calculation (min): 30 min  Charges: OT General Charges $OT Visit: 1 Visit OT Treatments $Self Care/Home Management : 8-22 mins  Alfonzo Beers, OTD, OTR/L Acute Rehab [phone]   Rolm Gala Evan Mackie 04/26/2022, 4:12 PM

## 2022-04-26 NOTE — Progress Notes (Signed)
Speech Language Pathology Treatment: Dysphagia  Patient Details Name: Alex Grant MRN: [id number] DOB: 27-Aug-1966 Today's Date: 04/26/2022 Time: 8127-5170 SLP Time Calculation (min) (ACUTE ONLY): 20 min  Assessment / Plan / Recommendation Clinical Impression  Treatment today primarily focused on swallowing, with SLP administering trials of advanced liquids and solids. Nectar thick liquids were offered first before solids were introduced given aspiration with mixed consistencies on MBS. He had anterior loss on his R side when drinking via cup, and although he did initiate attempts to self-manage via lingual sweep, he did not get his tongue all the way to the R to get all of what was spilling. Soft solids left moderate residue in his oral cavity, which did clear given honey thick liquid washes and verbal/tactile cues for lingual sweep. Again, note that there had been aspiration with mixed consistencies on MBS but it was with thinner liquids (also with with less solid foods though). Overall he does not have overt s/s of aspiration at bedside, but given that aspiration was silent on MBS, he remains at risk despite clinical presentation. He will benefit from ongoing trials of more advanced consistencies for potential to advance, and may even benefit from repeat MBS prior to d/c to SNF.   HPI HPI: Alex Grant is a 56 y.o. male who presented 04/17/22 after being found down at home with R sided hemiplegia, L gaze deviation, and aphasia. 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  PMH: HTN, HLD, prediabetes. Pt lives alone; has worked full time at [employer] for thirty years; MBS completed on 04/21/22 with recs for D1/HTL.  ST f/u for speech/language and dysphagia tx.      SLP Plan  Continue with current plan of care      Recommendations for follow up therapy are one component of a multi-disciplinary discharge planning process, led by the attending physician.  Recommendations may be updated  based on patient status, additional functional criteria and insurance authorization.    Recommendations  Diet recommendations: Dysphagia 1 (puree);Honey-thick liquid Liquids provided via: Cup Medication Administration: Crushed with puree Supervision: Staff to assist with self feeding;Full supervision/cueing for compensatory strategies Compensations: Small sips/bites;Slow rate;Multiple dry swallows after each bite/sip;Effortful swallow Postural Changes and/or Swallow Maneuvers: Seated upright 90 degrees                Oral Care Recommendations: Oral care BID;Staff/trained caregiver to provide oral care;Other (Comment) Follow Up Recommendations: Skilled nursing-short term rehab (<3 hours/day) Assistance recommended at discharge: Frequent or constant Supervision/Assistance SLP Visit Diagnosis: Dysphagia, oropharyngeal phase (R13.12);Aphasia (R47.01) Plan: Continue with current plan of care           Mahala Menghini., M.A. CCC-SLP Acute Rehabilitation Services Office [phone]  Secure chat preferred   04/26/2022, 5:44 PM

## 2022-04-26 NOTE — Plan of Care (Signed)
  Problem: Education: Goal: Ability to describe self-care measures that may prevent or decrease complications (Diabetes Survival Skills Education) will improve Outcome: Progressing   Problem: Coping: Goal: Ability to adjust to condition or change in health will improve Outcome: Progressing   Problem: Fluid Volume: Goal: Ability to maintain a balanced intake and output will improve Outcome: Progressing   Problem: Health Behavior/Discharge Planning: Goal: Ability to identify and utilize available resources and services will improve Outcome: Progressing Goal: Ability to manage health-related needs will improve Outcome: Progressing   Problem: Metabolic: Goal: Ability to maintain appropriate glucose levels will improve Outcome: Progressing   Problem: Nutritional: Goal: Maintenance of adequate nutrition will improve Outcome: Progressing Goal: Progress toward achieving an optimal weight will improve Outcome: Progressing   Problem: Skin Integrity: Goal: Risk for impaired skin integrity will decrease Outcome: Progressing   Problem: Tissue Perfusion: Goal: Adequacy of tissue perfusion will improve Outcome: Progressing   Problem: Education: Goal: Knowledge of General Education information will improve Description: Including pain rating scale, medication(s)/side effects and non-pharmacologic comfort measures Outcome: Progressing   Problem: Health Behavior/Discharge Planning: Goal: Ability to manage health-related needs will improve Outcome: Progressing   Problem: Clinical Measurements: Goal: Ability to maintain clinical measurements within normal limits will improve Outcome: Progressing Goal: Will remain free from infection Outcome: Progressing Goal: Diagnostic test results will improve Outcome: Progressing Goal: Respiratory complications will improve Outcome: Progressing Goal: Cardiovascular complication will be avoided Outcome: Progressing

## 2022-04-26 NOTE — Progress Notes (Addendum)
Physical Therapy Treatment  Patient Details Name: Alex Grant MRN: [id number] DOB: 18-Apr-1966 Today's Date: 04/26/2022   History of Present Illness Pt is a 56 y/o male who presented 04/17/22 after being found down at home with R sided hemiplegia, L gaze deviation, and aphasia. 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 PMH: HTN, prediabetes    PT Comments    Pt progressing towards physical therapy goals. Was able to perform transfers with up to +2 mod assist and initiate gait training this session. 3rd person helpful for gait training as pt requires assist for RLE advancement. Will continue to follow and progress as able per POC.    Recommendations for follow up therapy are one component of a multi-disciplinary discharge planning process, led by the attending physician.  Recommendations may be updated based on patient status, additional functional criteria and insurance authorization.  Follow Up Recommendations  Skilled nursing-short term rehab (<3 hours/day) Can patient physically be transported by private vehicle: No   Assistance Recommended at Discharge Frequent or constant Supervision/Assistance  Patient can return home with the following Two people to help with walking and/or transfers;Two people to help with bathing/dressing/bathroom;Assistance with cooking/housework;Assistance with feeding;Direct supervision/assist for medications management;Direct supervision/assist for financial management;Assist for transportation;Help with stairs or ramp for entrance   Equipment Recommendations  BSC/3in1;Wheelchair (measurements PT);Wheelchair cushion (measurements PT);Hospital bed;Other (comment)    Recommendations for Other Services Rehab consult     Precautions / Restrictions Precautions Precautions: Fall Restrictions Weight Bearing Restrictions: No     Mobility  Bed Mobility Overal bed mobility: Needs Assistance Bed Mobility: Supine to Sit      Supine to sit: Mod assist, +2 for physical assistance, HOB elevated     General bed mobility comments: Bed pad and +2 assist utilized for transition around to EOB.    Transfers Overall transfer level: Needs assistance Equipment used: 2 person hand held assist Transfers: Sit to/from Stand Sit to Stand: Mod assist, +2 safety/equipment, From elevated surface           General transfer comment: +2 present for power up to full standing position. Once in standing, pt's UE's were placed over therapists' shoulders for "3 musketeer" assist in preparation to ambulate.    Ambulation/Gait Ambulation/Gait assistance: Max assist, +2 physical assistance, +2 safety/equipment Gait Distance (Feet): 5 Feet Assistive device: 2 person hand held assist Gait Pattern/deviations: Step-to pattern, Decreased step length - right, Decreased step length - left, Decreased weight shift to left, Decreased weight shift to right, Trunk flexed, Knees buckling Gait velocity: Decreased Gait velocity interpretation: 1.31 - 2.62 ft/sec, indicative of limited community ambulator   General Gait Details: 3 Musketeer hold with +2 assist, and third person helping to advance RLE and then block R knee during LLE swing through. Therapists facilitated weight shift and pt was able to advance LLE.   Stairs             Wheelchair Mobility    Modified Rankin (Stroke Patients Only) Modified Rankin (Stroke Patients Only) Pre-Morbid Rankin Score: No symptoms Modified Rankin: Severe disability     Balance Overall balance assessment: Needs assistance Sitting-balance support: Feet supported Sitting balance-Leahy Scale: Poor Sitting balance - Comments: Min Guard A - Min A for sitting balance Postural control: Right lateral lean Standing balance support: Single extremity supported, During functional activity, Reliant on assistive device for balance Standing balance-Leahy Scale: Poor  Cognition Arousal/Alertness: Awake/alert Behavior During Therapy: Flat affect Overall Cognitive Status: Impaired/Different from baseline Area of Impairment: Following commands, Safety/judgement, Awareness, Problem solving, Attention                   Current Attention Level: Sustained   Following Commands: Follows one step commands consistently, Follows one step commands with increased time, Follows multi-step commands inconsistently Safety/Judgement: Decreased awareness of safety, Decreased awareness of deficits Awareness: Intellectual Problem Solving: Slow processing, Decreased initiation, Difficulty sequencing, Requires verbal cues, Requires tactile cues General Comments: Decreased communication this session. Staring at times but decreased pointing, thumbs up/down, and expressing needs.        Exercises      General Comments        Pertinent Vitals/Pain Pain Assessment Pain Assessment: No/denies pain Faces Pain Scale: No hurt    Home Living                          Prior Function            PT Goals (current goals can now be found in the care plan section) Acute Rehab PT Goals Patient Stated Goal: did not state PT Goal Formulation: Patient unable to participate in goal setting Time For Goal Achievement: 05/02/22 Potential to Achieve Goals: Fair Progress towards PT goals: Progressing toward goals    Frequency    Min 3X/week      PT Plan Current plan remains appropriate    Co-evaluation PT/OT/SLP Co-Evaluation/Treatment: Yes Reason for Co-Treatment: Necessary to address cognition/behavior during functional activity;For patient/therapist safety;To address functional/ADL transfers PT goals addressed during session: Mobility/safety with mobility;Balance;Strengthening/ROM        AM-PAC PT "6 Clicks" Mobility   Outcome Measure  Help needed turning from your back to your side while in a flat bed without using bedrails?: A Lot Help  needed moving from lying on your back to sitting on the side of a flat bed without using bedrails?: A Lot Help needed moving to and from a bed to a chair (including a wheelchair)?: Total Help needed standing up from a chair using your arms (e.g., wheelchair or bedside chair)?: A Lot Help needed to walk in hospital room?: Total Help needed climbing 3-5 steps with a railing? : Total 6 Click Score: 9    End of Session Equipment Utilized During Treatment: Gait belt Activity Tolerance: Patient tolerated treatment well Patient left: in chair;with call bell/phone within reach;with family/visitor present;with chair alarm set Nurse Communication: Mobility status;Need for lift equipment PT Visit Diagnosis: Other abnormalities of gait and mobility (R26.89);Unsteadiness on feet (R26.81);Difficulty in walking, not elsewhere classified (R26.2);Hemiplegia and hemiparesis Hemiplegia - Right/Left: Right Hemiplegia - caused by: Cerebral infarction     Time: 4193-7902 PT Time Calculation (min) (ACUTE ONLY): 37 min  Charges:  $Gait Training: 8-22 mins                     Conni Slipper, PT, DPT Acute Rehabilitation Services Secure Chat Preferred Office: [phone]    Marylynn Pearson 04/26/2022, 3:07 PM

## 2022-04-26 NOTE — Progress Notes (Signed)
PROGRESS NOTE    Wyatt Thorstenson  HEN:[id number] DOB: 1965/11/08 DOA: 04/17/2022 PCP: Patient, No Pcp Per   Brief Narrative:  Mr. Gutman is a 56 yo male with PMH HTN, HLD, prediabetes who presented via EMS after being found down at home.  Patient was unable to provide any information due to unresponsiveness on admission and all collateral information was obtained from family.  It appears he was last communicated with on Thursday via phone conversation with his dad.  When EMS found him, he was still in bed when found during welfare check on Saturday.  CT head showed acute infarct involving left basal ganglia, no hemorrhage.  MRI brain also performed after admission which also confirmed acute infarct involving left basal ganglia and corona radiata.    Patient continues to work with SLP, PT, hoping for disposition to skilled nursing facility soon for ongoing continued physical therapy and rehabilitation. He remains medically stable for dispo to SNF. Awaiting bed/insurance authorization at this time.  Assessment & Plan:   Principal Problem:   Acute ischemic stroke (HCC) Active Problems:   SIRS (systemic inflammatory response syndrome) (HCC)   Dysphagia   AKI (acute kidney injury) (HCC)   Rhabdomyolysis   Metabolic acidosis   Elevated LFTs   Hypertension   Prolonged QT interval   Prediabetes   Right thyroid nodule   HLD (hyperlipidemia)  Acute ischemic stroke (HCC) - CT head and MRI brain revealed left basal ganglia and corona radiata stroke - Patient has dense right-sided hemiparesis and appears to have right - sided neglect and mutism -can communicates her left hand thumbs up thumbs down and writing - continue DAPT per neuro (asa for 3 weeks then continue on Plavix only) - Cortrak placement on 6/26 -discontinued 6/30 -tolerating p.o. quite appropriately - statin on hold until LFTs improve further -CMP pending -Tentative plan for 30 day monitor at discharge per neuro rec's -on telemetry  in the interim - If no significant improvement in the next few days or any further decline, low threshold for consulting    Dysphagia, improving - Dysphagia improving Cortrak discontinued, continue to advance diet as tolerated per SLP.   Hypertensive emergency - Permissive hypertension window closed - Goal systolic blood pressure 130-140 - Continues to be moderately well controlled on current regimen - avoid hypotension.  SIRS (systemic inflammatory response syndrome) (HCC) - SEPSIS ruled out - Procalcitonin negative, single febrile event - likely reactive in the setting of large stroke as above as well as profound rhabdomyolysis -Imaging and work-up thus far remain negative -Pneumonia ruled out (remains high risk for aspiration given above dysphagia)   Rhabdomyolysis, resolving - Resolving - increase PO intake as tolerated - CK 3,583 on admission - downtrending appropriately - no longer following - continue to increase p.o. intake as appropriate   AKI (acute kidney injury) (HCC), resolved - baseline creatinine ~ 1 - Likely secondary to profound rhabdo vs ATN from prolonged pre-renal (still at risk) - Renal function downtrending appropriately   HLD (hyperlipidemia) - CHL 317, TG 345, HDL 31, LDL 217 - Will eventually need statin but on hold for now in setting of elevated LFTs   Right thyroid nodule - Incidentally noted on CT on admission - 2.2 cm right thyroid nodule - Outpatient thyroid ultrasound and further work-up   Prediabetes - A1c 5.9% - Diet control once able to initiate tube feeds   Prolonged QT interval - QTtc 519 on EKG - monitor K and Mg     Elevated LFTs -  elevated AST/ALT. No signs of obstructive pattern - suspected from acute illness/rhabdo -Hepatitis panel negative - hold off on statin for CVA until improvement - continue trending    Metabolic acidosis - See AKI and rhabdomyolysis - Continue fluids - Trend BMP  DVT prophylaxis: Heparin Code  Status: Full Family Communication: None present -called without answer  Status is: Inpatient  Dispo: The patient is from: Home              Anticipated d/c is to: To be determined              Anticipated d/c date is: 72+ hours              Patient currently not medically stable for discharge  Consultants:  Neurology  Procedures:  None  Antimicrobials:  Discontinued 627  Subjective: No acute issues or events overnight  Objective: Vitals:   04/25/22 1945 04/25/22 2329 04/26/22 0338 04/26/22 0500  BP: 133/63 125/64 (!) 144/71   Pulse: 81 74 80   Resp: (!) 22 (!) 21 17   Temp: 98.6 F (37 C) 97.8 F (36.6 C) 98 F (36.7 C)   TempSrc: Oral Oral Oral   SpO2: 96% 100% 97%   Weight:    86.2 kg  Height:       No intake or output data in the 24 hours ending 04/26/22 0732  Filed Weights   04/23/22 0500 04/25/22 0500 04/26/22 0500  Weight: 85.3 kg 85.7 kg 86.2 kg    Examination:  General:  Pleasantly resting in bed, No acute distress. HEENT: Expressive aphasia without airway compromise, core track NG tube in place Lungs:  Clear to auscultate bilaterally without rhonchi, wheeze, or rales. Heart:  Regular rate and rhythm.  Without murmurs, rubs, or gallops. Abdomen:  Soft, nontender, nondistended.  Without guarding or rebound. Extremities: Profound right-sided weakness 1 out of 5 strength globally Vascular:  Dorsalis pedis and posterior tibial pulses palpable bilaterally. Skin:  Warm and dry, no erythema, no ulcerations.  Data Reviewed: I have personally reviewed following labs and imaging studies  CBC: Recent Labs  Lab 04/20/22 0747 04/21/22 0436 04/22/22 0355 04/23/22 0457  WBC 12.5* 10.2 13.1* 14.5*  NEUTROABS 9.1* 6.8 8.7*  --   HGB 15.9 16.6 15.8 13.9  HCT 50.3 52.1* 49.6 44.0  MCV 90.8 92.0 92.4 91.1  PLT 284 248 223 200    Basic Metabolic Panel: Recent Labs  Lab 04/19/22 1657 04/20/22 0747 04/20/22 0747 04/20/22 1817 04/21/22 0436  04/21/22 1754 04/22/22 0355 04/23/22 0457 04/25/22 0604  NA  --  154*  --   --   --   --   --  143 140  K  --  3.5  --   --   --   --   --  3.6 3.8  CL  --  120*  --   --   --   --   --  111 108  CO2  --  23  --   --   --   --   --  24 24  GLUCOSE  --  160*  --   --   --   --   --  157* 127*  BUN  --  29*  --   --   --   --   --  31* 24*  CREATININE  --  1.39*  --   --   --   --   --  0.96 0.98  CALCIUM  --  8.7*  --   --   --   --   --  8.5* 8.3*  MG  --  3.0*   < > 2.9* 2.9* 2.6* 2.5* 2.2  --   PHOS 4.1 3.0  --  2.5  --   --   --   --   --    < > = values in this interval not displayed.    GFR: Estimated Creatinine Clearance: 88.8 mL/min (by C-G formula based on SCr of 0.98 mg/dL). Liver Function Tests: Recent Labs  Lab 04/20/22 0747 04/25/22 0604  AST 83* 44*  ALT 69* 66*  ALKPHOS 60 47  BILITOT 0.7 1.0  PROT 6.4* 5.8*  ALBUMIN 3.1* 2.6*    No results for input(s): "LIPASE", "AMYLASE" in the last 168 hours. No results for input(s): "AMMONIA" in the last 168 hours. Coagulation Profile: No results for input(s): "INR", "PROTIME" in the last 168 hours.  Cardiac Enzymes: Recent Labs  Lab 04/20/22 0747  CKTOTAL 1,540*    BNP (last 3 results) No results for input(s): "PROBNP" in the last 8760 hours. HbA1C: No results for input(s): "HGBA1C" in the last 72 hours.  CBG: Recent Labs  Lab 04/25/22 0806 04/25/22 1139 04/25/22 1602 04/25/22 2217 04/26/22 0634  GLUCAP 130* 177* 203* 153* 123*    Lipid Profile: No results for input(s): "CHOL", "HDL", "LDLCALC", "TRIG", "CHOLHDL", "LDLDIRECT" in the last 72 hours.  Thyroid Function Tests: No results for input(s): "TSH", "T4TOTAL", "FREET4", "T3FREE", "THYROIDAB" in the last 72 hours. Anemia Panel: No results for input(s): "VITAMINB12", "FOLATE", "FERRITIN", "TIBC", "IRON", "RETICCTPCT" in the last 72 hours. Sepsis Labs: Recent Labs  Lab 04/20/22 0747  PROCALCITON <0.10     Recent Results (from the past  240 hour(s))  SARS Coronavirus 2 by RT PCR (hospital order, performed in [facility] hospital lab) *cepheid single result test* Anterior Nasal Swab     Status: None   Collection Time: 04/17/22  9:20 PM   Specimen: Anterior Nasal Swab  Result Value Ref Range Status   SARS Coronavirus 2 by RT PCR NEGATIVE NEGATIVE Final    Comment: (NOTE) SARS-CoV-2 target nucleic acids are NOT DETECTED.  The SARS-CoV-2 RNA is generally detectable in upper and lower respiratory specimens during the acute phase of infection. The lowest concentration of SARS-CoV-2 viral copies this assay can detect is 250 copies / mL. A negative result does not preclude SARS-CoV-2 infection and should not be used as the sole basis for treatment or other patient management decisions.  A negative result may occur with improper specimen collection / handling, submission of specimen other than nasopharyngeal swab, presence of viral mutation(s) within the areas targeted by this assay, and inadequate number of viral copies (<250 copies / mL). A negative result must be combined with clinical observations, patient history, and epidemiological information.  Fact Sheet for Patients:   RoadLapTop.co.za  Fact Sheet for Healthcare Providers: http://kim-miller.com/  This test is not yet approved or  cleared by the [place] FDA and has been authorized for detection and/or diagnosis of SARS-CoV-2 by FDA under an Emergency Use Authorization (EUA).  This EUA will remain in effect (meaning this test can be used) for the duration of the COVID-19 declaration under Section 564(b)(1) of the Act, 21 U.S.C. section 360bbb-3(b)(1), unless the authorization is terminated or revoked sooner.  Performed at [facility], [address] [place], [place] [address]   Urine Culture     Status: None   Collection Time: 04/18/22  2:47 AM   Specimen: In/Out Cath Urine  Result Value Ref Range  Status   Specimen Description IN/OUT CATH URINE  Final   Special Requests NONE  Final   Culture   Final    NO GROWTH Performed at [facility], [address], [place], [place] [address]    Report Status 04/19/2022 FINAL  Final  Culture, blood (Routine X 2) w Reflex to ID Panel     Status: None   Collection Time: 04/18/22  3:54 AM   Specimen: BLOOD RIGHT HAND  Result Value Ref Range Status   Specimen Description BLOOD RIGHT HAND  Final   Special Requests   Final    BOTTLES DRAWN AEROBIC AND ANAEROBIC Blood Culture adequate volume   Culture   Final    NO GROWTH 5 DAYS Performed at [facility], [address] [place], [place] [address]    Report Status 04/23/2022 FINAL  Final  Culture, blood (Routine X 2) w Reflex to ID Panel     Status: None   Collection Time: 04/18/22  3:54 AM   Specimen: BLOOD  Result Value Ref Range Status   Specimen Description BLOOD RIGHT ANTECUBITAL  Final   Special Requests   Final    BOTTLES DRAWN AEROBIC AND ANAEROBIC Blood Culture adequate volume   Culture   Final    NO GROWTH 5 DAYS Performed at [facility], [address], [place], [place] [address]    Report Status 04/23/2022 FINAL  Final         Radiology Studies: No results found.      Scheduled Meds:  amLODipine  10 mg Oral Daily   aspirin  81 mg Oral Daily   carvedilol  25 mg Oral BID WC   clopidogrel  75 mg Oral Daily   heparin  5,000 Units Subcutaneous Q8H   insulin aspart  0-6 Units Subcutaneous TID AC & HS   irbesartan  75 mg Oral Daily   multivitamin with minerals  1 tablet Oral Daily   mouth rinse  15 mL Mouth Rinse 4 times per day   rosuvastatin  40 mg Oral q1800   Continuous Infusions:     LOS: 9 days   Time spent:  Azucena Fallen, DO [facility]  If 7PM-7AM, please contact night-coverage [email or web address]  04/26/2022, 7:32 AM

## 2022-04-26 NOTE — TOC Progression Note (Signed)
Transition of Care [facility]) - Progression Note    Patient Details  Name: Alex Grant MRN: [id number] Date of Birth: 1966/03/18  Transition of Care [facility]) CM/SW Contact  Carley Hammed, [place] Phone Number: 04/26/2022, 1:45 PM  Clinical Narrative:     CSW requested [facility] [employer] review pt, they offered a bed. Family has accepted the bed, facility to start authorization. TOC will continue to follow for DC needs.  Expected Discharge Plan: Skilled Nursing Facility Barriers to Discharge: Continued Medical Work up, English as a second language teacher  Expected Discharge Plan and Services Expected Discharge Plan: Skilled Nursing Facility     Post Acute Care Choice: Skilled Nursing Facility Living arrangements for the past 2 months: Single Family Home                                       Social Determinants of Health (SDOH) Interventions    Readmission Risk Interventions     No data to display

## 2022-04-27 DIAGNOSIS — I69920 Aphasia following unspecified cerebrovascular disease: Secondary | ICD-10-CM | POA: Diagnosis not present

## 2022-04-27 DIAGNOSIS — R2681 Unsteadiness on feet: Secondary | ICD-10-CM | POA: Diagnosis not present

## 2022-04-27 DIAGNOSIS — R5383 Other fatigue: Secondary | ICD-10-CM | POA: Diagnosis not present

## 2022-04-27 DIAGNOSIS — I69828 Other speech and language deficits following other cerebrovascular disease: Secondary | ICD-10-CM | POA: Diagnosis not present

## 2022-04-27 DIAGNOSIS — M2550 Pain in unspecified joint: Secondary | ICD-10-CM | POA: Diagnosis not present

## 2022-04-27 DIAGNOSIS — G8111 Spastic hemiplegia affecting right dominant side: Secondary | ICD-10-CM | POA: Diagnosis not present

## 2022-04-27 DIAGNOSIS — I69351 Hemiplegia and hemiparesis following cerebral infarction affecting right dominant side: Secondary | ICD-10-CM | POA: Diagnosis not present

## 2022-04-27 DIAGNOSIS — R7303 Prediabetes: Secondary | ICD-10-CM | POA: Diagnosis not present

## 2022-04-27 DIAGNOSIS — I69921 Dysphasia following unspecified cerebrovascular disease: Secondary | ICD-10-CM | POA: Diagnosis not present

## 2022-04-27 DIAGNOSIS — I639 Cerebral infarction, unspecified: Secondary | ICD-10-CM | POA: Diagnosis not present

## 2022-04-27 DIAGNOSIS — I10 Essential (primary) hypertension: Secondary | ICD-10-CM | POA: Diagnosis not present

## 2022-04-27 DIAGNOSIS — F5102 Adjustment insomnia: Secondary | ICD-10-CM | POA: Diagnosis not present

## 2022-04-27 DIAGNOSIS — I69320 Aphasia following cerebral infarction: Secondary | ICD-10-CM | POA: Diagnosis not present

## 2022-04-27 DIAGNOSIS — R1310 Dysphagia, unspecified: Secondary | ICD-10-CM | POA: Diagnosis not present

## 2022-04-27 DIAGNOSIS — M6281 Muscle weakness (generalized): Secondary | ICD-10-CM | POA: Diagnosis not present

## 2022-04-27 DIAGNOSIS — I959 Hypotension, unspecified: Secondary | ICD-10-CM | POA: Diagnosis not present

## 2022-04-27 DIAGNOSIS — I4891 Unspecified atrial fibrillation: Secondary | ICD-10-CM | POA: Diagnosis not present

## 2022-04-27 DIAGNOSIS — Z7401 Bed confinement status: Secondary | ICD-10-CM | POA: Diagnosis not present

## 2022-04-27 DIAGNOSIS — I6381 Other cerebral infarction due to occlusion or stenosis of small artery: Secondary | ICD-10-CM | POA: Diagnosis not present

## 2022-04-27 DIAGNOSIS — F32A Depression, unspecified: Secondary | ICD-10-CM | POA: Diagnosis not present

## 2022-04-27 DIAGNOSIS — E785 Hyperlipidemia, unspecified: Secondary | ICD-10-CM | POA: Diagnosis not present

## 2022-04-27 DIAGNOSIS — I69891 Dysphagia following other cerebrovascular disease: Secondary | ICD-10-CM | POA: Diagnosis not present

## 2022-04-27 LAB — GLUCOSE, CAPILLARY
Glucose-Capillary: 162 mg/dL — ABNORMAL HIGH (ref 70–99)
Glucose-Capillary: 226 mg/dL — ABNORMAL HIGH (ref 70–99)
Glucose-Capillary: 131 mg/dL — ABNORMAL HIGH (ref 70–99)

## 2022-04-27 MED ORDER — CLOPIDOGREL BISULFATE 75 MG PO TABS: 75.0000 mg | ORAL_TABLET | Freq: Every day | ORAL | 0 refills | Status: AC

## 2022-04-27 MED ORDER — IRBESARTAN 75 MG PO TABS: 75.0000 mg | ORAL_TABLET | Freq: Every day | ORAL | 0 refills | Status: DC

## 2022-04-27 MED ORDER — ROSUVASTATIN CALCIUM 40 MG PO TABS: 40.0000 mg | ORAL_TABLET | Freq: Every day | ORAL | 2 refills | Status: DC

## 2022-04-27 MED ORDER — CARVEDILOL 25 MG PO TABS: 25.0000 mg | ORAL_TABLET | Freq: Two times a day (BID) | ORAL | 0 refills | Status: DC

## 2022-04-27 NOTE — Progress Notes (Signed)
Called report to [facility] spoke to [employer].

## 2022-04-27 NOTE — Discharge Summary (Signed)
Physician Discharge Summary  Alex Grant QIW:[id number] DOB: 1966-02-14 DOA: 04/17/2022  PCP: Patient, No Pcp Per  Admit date: 04/17/2022 Discharge date: 04/27/2022  Admitted From: Home  Disposition: SNF  Recommendations for Outpatient Follow-up:  Follow up with PCP in 1-2 weeks Follow-up with neurology as scheduled  Discharge Condition: Stable  CODE STATUS: Full Diet recommendation:   Dysphagia 1 (puree);Honey-thick liquid Liquids provided via: Cup Medication Administration: Crushed with puree Supervision: Staff to assist with self feeding;Full supervision/cueing for compensatory strategies Compensations: Small sips/bites;Slow rate;Multiple dry swallows after each bite/sip;Effortful swallow Postural Changes and/or Swallow Maneuvers: Seated upright 90 degrees  Brief/Interim Summary: Alex Grant is a 56 yo male with PMH HTN, HLD, prediabetes who presented via EMS after being found down at home.  Patient was unable to provide any information due to unresponsiveness on admission and all collateral information was obtained from family.  It appears he was last communicated with on Thursday via phone conversation with his dad.  When EMS found him, he was still in bed when found during welfare check on Saturday.  CT head showed acute infarct involving left basal ganglia, no hemorrhage.  MRI brain also performed after admission which also confirmed acute infarct involving left basal ganglia and corona radiata.  Patient admitted as above with acute ischemic stroke with right-sided deficits and speech/swallow difficulties improving somewhat daily.  Patient's blood pressure is moderately well controlled on new regimen as below, initial SIRS criteria likely reactive in the setting of stroke, no indication for infection.  Rhabdomyolysis resolved, AKI resolving as well, increase p.o. intake now that he has passed speech with diet recommendations as above.  Patient's other chronic comorbid conditions  including hyperlipidemia prediabetes appear to be well controlled.  Continue dual antiplatelet therapy and high intensity statin per neurology.  Plavix for 21 days only and then discontinued, continue aspirin thereafter.     He remains medically stable for dispo to SNF -has secured bed and insurance approval today, otherwise agreeable for discharge.  Discharge Diagnoses:  Principal Problem:   Acute ischemic stroke (HCC) Active Problems:   SIRS (systemic inflammatory response syndrome) (HCC)   Dysphagia   AKI (acute kidney injury) (HCC)   Rhabdomyolysis   Metabolic acidosis   Elevated LFTs   Hypertension   Prolonged QT interval   Prediabetes   Right thyroid nodule   HLD (hyperlipidemia)    Discharge Instructions  Discharge Instructions     Ambulatory referral to Neurology   Complete by: As directed    Follow up with stroke clinic NP (Alex Grant or Alex Grant, if both not available, consider Alex Grant, or Alex Grant) at [facility] in about 4 weeks. Thanks.   Discharge patient   Complete by: As directed    Discharge disposition: 03-Skilled Nursing Facility   Discharge patient date: 04/27/2022      Allergies as of 04/27/2022       Reactions   Amoxil [amoxicillin] Hives   Atorvastatin    Other reaction(s): knee pain   Lisinopril    Other reaction(s): cough        Medication List     STOP taking these medications    hydrochlorothiazide 25 MG tablet Commonly known as: HYDRODIURIL   pravastatin 10 MG tablet Commonly known as: PRAVACHOL       TAKE these medications    amLODipine 10 MG tablet Commonly known as: NORVASC Take 1 tablet (10 mg total) by mouth daily.   aspirin EC 81 MG tablet Take 81 mg by mouth daily.  carvedilol 25 MG tablet Commonly known as: COREG Take 1 tablet (25 mg total) by mouth 2 (two) times daily with a meal.   clopidogrel 75 MG tablet Commonly known as: PLAVIX Take 1 tablet (75 mg total) by mouth daily for 20 days. Start  taking on: April 28, 2022   irbesartan 75 MG tablet Commonly known as: AVAPRO Take 1 tablet (75 mg total) by mouth daily. Start taking on: April 28, 2022   rosuvastatin 40 MG tablet Commonly known as: CRESTOR Take 1 tablet (40 mg total) by mouth daily at 6 PM.        Follow-up Information     [facility]. Schedule an appointment as soon as possible for a visit in 1 month(s).   Specialty: Neurology Why: stroke clinic Contact information: [address] [place] [address] [phone]               Allergies  Allergen Reactions   Amoxil [Amoxicillin] Hives   Atorvastatin     Other reaction(s): knee pain   Lisinopril     Other reaction(s): cough    Consultations: Neurology  Procedures/Studies: DG Swallowing Func-Speech Pathology  Result Date: 04/21/2022 Table formatting from the original result was not included. Objective Swallowing Evaluation: Type of Study: MBS-Modified Barium Swallow Study  Patient Details Name: Alex Grant MRN: [id number] Date of Birth: 1966-05-06 Today's Date: 04/21/2022 Time: SLP Start Time (ACUTE ONLY): 1005 -SLP Stop Time (ACUTE ONLY): 1025 SLP Time Calculation (min) (ACUTE ONLY): 20 min Past Medical History: Past Medical History: Diagnosis Date  High cholesterol   Hypertension  Past Surgical History: Past Surgical History: Procedure Laterality Date  NO PAST SURGERIES   HPI: Alex Grant is a 56 y.o. male who presented 04/17/22 after being found down at home with R sided hemiplegia, L gaze deviation, and aphasia. 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  PMH: HTN, HLD, prediabetes. Pt lives alone; has worked full time at [employer] for thirty years.  Subjective: pleasant, cooperative, non verbal  Recommendations for follow up therapy are one component of a multi-disciplinary discharge planning process, led by the attending physician.  Recommendations may be updated based on patient status,  additional functional criteria and insurance authorization. Assessment / Plan / Recommendation   04/21/2022  11:39 AM Clinical Impressions Clinical Impression Patient presents with a mod-severe oral dysphagia and a mild-moderate pharyngeal phase dysphagia as per this MBS. During oral phase, he exhibited very limited ability to masticate and orally transit soft solids and exhibited delayed anterior to posterior transit and oral residuals with all consistencies. He exhibited anterior spillage with thin, nectar and honey thick liquids. During pharyngeal phase, patient exhibited swallow initiation delays to level of vallecular sinus with puree, soft solids and honey thick liquids and delay to pyriform sinus with nectar thick and thin liquids. Delayed pharyngeal transit observed with puree, soft solid, honey thick liquids. Flash penetration occured with nectar thick and thin liquids with penetrate staying above vocal cords and fully exiting laryngeal vestibule. One instance of silent aspiration occured when patient took sip of thin liquid barium while he still had some puree solids in oral cavity. Mild vallecular residuals observed with soft solids bolus but cleared with subsequent swallows and no significant amount of pharyngeal residuals observed at end of study. No penetration or aspiration occurred with honey thick liquids. SLP is recommending initiate PO diet of Dys 1 (puree)solids and honey thick liquids. SLP Visit Diagnosis Dysphagia, oropharyngeal phase (R13.12) Impact on  safety and function Mild aspiration risk;Moderate aspiration risk     04/21/2022  11:39 AM Treatment Recommendations Treatment Recommendations Therapy as outlined in treatment plan below     04/21/2022  11:45 AM Prognosis Prognosis for Safe Diet Advancement Fair Barriers to Reach Goals Severity of deficits   04/21/2022  11:39 AM Diet Recommendations SLP Diet Recommendations Honey thick liquids;Dysphagia 1 (Puree) solids Liquid Administration via Cup  Medication Administration Crushed with puree Compensations Small sips/bites;Slow rate;Minimize environmental distractions;Monitor for anterior loss Postural Changes Seated upright at 90 degrees     04/21/2022  11:39 AM Other Recommendations Oral Care Recommendations Oral care BID;Staff/trained caregiver to provide oral care;Oral care before and after PO Other Recommendations Order thickener from pharmacy;Prohibited food (jello, ice cream, thin soups);Clarify dietary restrictions;Have oral suction available Follow Up Recommendations Skilled nursing-short term rehab (<3 hours/day) Assistance recommended at discharge Frequent or constant Supervision/Assistance Functional Status Assessment Patient has had a recent decline in their functional status and demonstrates the ability to make significant improvements in function in a reasonable and predictable amount of time.   04/21/2022  11:39 AM Frequency and Duration  Speech Therapy Frequency (ACUTE ONLY) min 2x/week     04/21/2022  11:35 AM Oral Phase Oral Phase Impaired Oral - Honey Cup Weak lingual manipulation;Reduced posterior propulsion;Lingual/palatal residue;Delayed oral transit;Piecemeal swallowing Oral - Nectar Cup Reduced posterior propulsion;Lingual/palatal residue;Weak lingual manipulation;Piecemeal swallowing;Delayed oral transit Oral - Thin Cup Reduced posterior propulsion;Delayed oral transit;Lingual/palatal residue;Weak lingual manipulation Oral - Puree Weak lingual manipulation;Reduced posterior propulsion;Delayed oral transit;Lingual/palatal residue Oral - Mech Soft Impaired mastication;Weak lingual manipulation;Delayed oral transit;Lingual/palatal residue;Holding of bolus    04/21/2022  11:37 AM Pharyngeal Phase Pharyngeal Phase Impaired Pharyngeal- Honey Cup Delayed swallow initiation-vallecula;Reduced pharyngeal peristalsis Pharyngeal- Nectar Cup Delayed swallow initiation-pyriform sinuses Pharyngeal- Thin Cup Delayed swallow initiation-pyriform  sinuses;Penetration/Aspiration during swallow Pharyngeal Material enters airway, passes BELOW cords without attempt by patient to eject out (silent aspiration) Pharyngeal- Puree Delayed swallow initiation-vallecula;Pharyngeal residue - valleculae;Reduced pharyngeal peristalsis Pharyngeal- Mechanical Soft Delayed swallow initiation-vallecula;Pharyngeal residue - valleculae;Reduced pharyngeal peristalsis    04/21/2022  11:39 AM Cervical Esophageal Phase  Cervical Esophageal Phase [facility]  Angela Nevin, MA, CCC-SLP Speech Therapy                     DG Abd Portable 1V  Result Date: 04/19/2022 CLINICAL DATA:  Feeding tube placement. EXAM: PORTABLE ABDOMEN - 1 VIEW COMPARISON:  None Available. FINDINGS: Tip of a small bore feeding tube is in the distal stomach or more likely duodenal bulb. Lung bases are clear.  Bowel gas pattern is otherwise unremarkable. IMPRESSION: Tip of small bore feeding tube in the distal stomach or duodenal bulb. Electronically Signed   By: Marin Roberts M.D.   On: 04/19/2022 14:10   DG CHEST PORT 1 VIEW  Result Date: 04/19/2022 CLINICAL DATA:  Chest pain EXAM: PORTABLE CHEST 1 VIEW COMPARISON:  04/17/2022 FINDINGS: No focal consolidation. No pleural effusion or pneumothorax. Heart and mediastinal contours are unremarkable. No acute osseous abnormality. IMPRESSION: No active disease. Electronically Signed   By: Elige Ko M.D.   On: 04/19/2022 13:19   MR BRAIN WO CONTRAST  Result Date: 04/18/2022 CLINICAL DATA:  Stroke workup EXAM: MRI HEAD WITHOUT CONTRAST TECHNIQUE: Multiplanar, multiecho pulse sequences of the brain and surrounding structures were obtained without intravenous contrast. COMPARISON:  Head CT and CTA from yesterday FINDINGS: Brain: Wedge of restricted diffusion traversing the left stratum and corona radiata. A focus of diffusion hyperintensity along the ventral right  frontal lobe is likely susceptibility artifact based on coronal diffusion acquisition. No  hemorrhage, hydrocephalus, mass, or collection. Generalized cerebral volume loss, notable for age. Chronic small vessel ischemic type change in the hemispheric white matter with chronic lacune in the left frontal white matter, right thalamus, and in the pons. Vascular: Major flow voids are preserved.  There was preceding CTA Skull and upper cervical spine: No focal marrow lesion. Sinuses/Orbits: Patchy mucosal thickening throughout the paranasal sinuses, extensive. Other: Intermittent motion degradation. IMPRESSION: 1. Acute perforator infarct at the left basal ganglia and corona radiata. 2. Chronic small vessel ischemia. 3. Generalized sinusitis. Electronically Signed   By: Tiburcio Pea M.D.   On: 04/18/2022 11:12   ECHOCARDIOGRAM COMPLETE  Result Date: 04/18/2022    ECHOCARDIOGRAM REPORT   Patient Name:   JAKOBE BLAU Date of Exam: 04/18/2022 Medical Rec #:  [id number]    Height:       68.0 in Accession #:    [id number]   Weight:       187.4 lb Date of Birth:  1966/08/01    BSA:          1.988 m Patient Age:    55 years     BP:           161/78 mmHg Patient Gender: M            HR:           90 bpm. Exam Location:  Inpatient Procedure: 2D Echo, Cardiac Doppler and Color Doppler Indications:    Stroke  History:        Patient has no prior history of Echocardiogram examinations.                 Risk Factors:Hypertension and Dyslipidemia. Pre-diabetes.  Sonographer:    Ross Ludwig RDCS (AE) Referring Phys: [id number] TIMOTHY S OPYD IMPRESSIONS  1. Left ventricular ejection fraction, by estimation, is 65 to 70%. The left ventricle has normal function. The left ventricle has no regional wall motion abnormalities. There is severe left ventricular hypertrophy. Left ventricular diastolic parameters  are consistent with Grade I diastolic dysfunction (impaired relaxation). Elevated left atrial pressure.  2. Right ventricular systolic function is normal. The right ventricular size is normal.  3. The mitral valve is normal  in structure. No evidence of mitral valve regurgitation. No evidence of mitral stenosis.  4. The aortic valve is calcified. There is mild calcification of the aortic valve. There is mild thickening of the aortic valve. Aortic valve regurgitation is moderate to severe. Aortic valve sclerosis is present, with no evidence of aortic valve stenosis. Aortic regurgitation PHT measures 311 msec.  5. The inferior vena cava is normal in size with greater than 50% respiratory variability, suggesting right atrial pressure of 3 mmHg. Conclusion(s)/Recommendation(s): No intracardiac source of embolism detected on this transthoracic study. Consider a transesophageal echocardiogram to exclude cardiac source of embolism if clinically indicated. FINDINGS  Left Ventricle: Left ventricular ejection fraction, by estimation, is 65 to 70%. The left ventricle has normal function. The left ventricle has no regional wall motion abnormalities. The left ventricular internal cavity size was normal in size. There is  severe left ventricular hypertrophy. Left ventricular diastolic parameters are consistent with Grade I diastolic dysfunction (impaired relaxation). Elevated left atrial pressure. Right Ventricle: The right ventricular size is normal. No increase in right ventricular wall thickness. Right ventricular systolic function is normal. Left Atrium: Left atrial size was normal in size. Right Atrium: Right atrial size was  normal in size. Pericardium: There is no evidence of pericardial effusion. Mitral Valve: The mitral valve is normal in structure. No evidence of mitral valve regurgitation. No evidence of mitral valve stenosis. Tricuspid Valve: The tricuspid valve is normal in structure. Tricuspid valve regurgitation is not demonstrated. No evidence of tricuspid stenosis. Aortic Valve: The aortic valve is calcified. There is mild calcification of the aortic valve. There is mild thickening of the aortic valve. Aortic valve regurgitation is  moderate to severe. Aortic regurgitation PHT measures 311 msec. Aortic valve sclerosis is present, with no evidence of aortic valve stenosis. Aortic valve mean gradient measures 6.0 mmHg. Aortic valve peak gradient measures 11.3 mmHg. Aortic valve area, by VTI measures 2.91 cm. Pulmonic Valve: The pulmonic valve was normal in structure. Pulmonic valve regurgitation is not visualized. No evidence of pulmonic stenosis. Aorta: The aortic root is normal in size and structure. Venous: The inferior vena cava is normal in size with greater than 50% respiratory variability, suggesting right atrial pressure of 3 mmHg. IAS/Shunts: No atrial level shunt detected by color flow Doppler.  LEFT VENTRICLE PLAX 2D LVIDd:         5.30 cm   Diastology LVIDs:         3.30 cm   LV e' medial:    3.48 cm/s LV PW:         1.70 cm   LV E/e' medial:  19.5 LV IVS:        1.40 cm   LV e' lateral:   4.68 cm/s LVOT diam:     2.20 cm   LV E/e' lateral: 14.5 LV SV:         76 LV SV Index:   38 LVOT Area:     3.80 cm  RIGHT VENTRICLE RV Basal diam:  2.20 cm RV S prime:     15.00 cm/s TAPSE (M-mode): 2.3 cm LEFT ATRIUM             Index        RIGHT ATRIUM           Index LA diam:        2.20 cm 1.11 cm/m   RA Area:     12.80 cm LA Vol (A2C):   33.3 ml 16.75 ml/m  RA Volume:   28.50 ml  14.34 ml/m LA Vol (A4C):   25.8 ml 12.98 ml/m LA Biplane Vol: 29.4 ml 14.79 ml/m  AORTIC VALVE AV Area (Vmax):    3.24 cm AV Area (Vmean):   3.33 cm AV Area (VTI):     2.91 cm AV Vmax:           168.00 cm/s AV Vmean:          113.000 cm/s AV VTI:            0.261 m AV Peak Grad:      11.3 mmHg AV Mean Grad:      6.0 mmHg LVOT Vmax:         143.00 cm/s LVOT Vmean:        99.000 cm/s LVOT VTI:          0.200 m LVOT/AV VTI ratio: 0.77 AI PHT:            311 msec  AORTA Ao Root diam: 4.10 cm Ao Asc diam:  3.80 cm MITRAL VALVE MV Area (PHT): 3.85 cm    SHUNTS MV Decel Time: 197 msec    Systemic VTI:  0.20 m MV  E velocity: 67.70 cm/s  Systemic Diam: 2.20 cm MV  A velocity: 81.40 cm/s MV E/A ratio:  0.83 Donato Schultz MD Electronically signed by Donato Schultz MD Signature Date/Time: 04/18/2022/10:24:58 AM    Final    EEG adult  Result Date: 04/18/2022 Charlsie Quest, MD     04/18/2022  9:07 AM Patient Name: Alex Grant MRN: [id number] Epilepsy Attending: Charlsie Quest Referring Physician/Provider: Marjorie Smolder, NP Date: 04/17/2022 Duration: 21.09 mins Patient history: 56 year old patient presents with right hemiplegia, left gaze preference and global aphasia. EEG to evaluate for seizure Level of alertness: Awake, asleep AEDs during EEG study: None Technical aspects: This EEG study was done with scalp electrodes positioned according to the 10-20 International system of electrode placement. Electrical activity was acquired at a sampling rate of  and reviewed with a high frequency filter of  and a low frequency filter of . EEG data were recorded continuously and digitally stored. Description: The posterior dominant rhythm consists of 8 Hz activity of moderate voltage (25-35 uV) seen predominantly in posterior head regions, symmetric and reactive to eye opening and eye closing. Sleep was characterized by vertex waves, sleep spindles (12 to 14 Hz), maximal frontocentral region. EEG showed intermittent generalized 3 to 6 Hz theta-delta slowing. Hyperventilation and photic stimulation were not performed.   ABNORMALITY - Intermittent slow, generalized IMPRESSION: This study is suggestive of mild diffuse encephalopathy, nonspecific etiology. No seizures or epileptiform discharges were seen throughout the recording. Priyanka Annabelle Harman   US RENAL  Result Date: 04/17/2022 CLINICAL DATA:  Acute kidney injury. EXAM: RENAL / URINARY TRACT ULTRASOUND COMPLETE COMPARISON:  None Available. FINDINGS: Right Kidney: Renal measurements: 11.8 x 5.4 x 5.6 cm = volume: 180 mL. Echogenicity within normal limits. No mass or hydronephrosis visualized. Left Kidney: Renal  measurements: 11.7 x 5.7 x 5.1 cm = volume: 176 mL. Echogenicity within normal limits. No mass or hydronephrosis visualized. Bladder: Appears normal for degree of bladder distention. Other: None. IMPRESSION: Normal renal ultrasound. Electronically Signed   By: Darliss Cheney M.D.   On: 04/17/2022 21:19   DG Chest Port 1 View  Result Date: 04/17/2022 CLINICAL DATA:  CVA, altered EXAM: PORTABLE CHEST 1 VIEW COMPARISON:  06/26/2014 FINDINGS: Mild cardiomegaly. No confluent opacities, effusions or edema. No acute bony abnormality. IMPRESSION: No active disease. Electronically Signed   By: Charlett Nose M.D.   On: 04/17/2022 18:23   CT ANGIO HEAD NECK W WO CM W PERF (CODE STROKE)  Result Date: 04/17/2022 CLINICAL DATA:  Stroke.  Left MCA syndrome.  Right hemiplegia. EXAM: CT ANGIOGRAPHY HEAD AND NECK CT PERFUSION BRAIN TECHNIQUE: Multidetector CT imaging of the head and neck was performed using the standard protocol during bolus administration of intravenous contrast. Multiplanar CT image reconstructions and MIPs were obtained to evaluate the vascular anatomy. Carotid stenosis measurements (when applicable) are obtained utilizing NASCET criteria, using the distal internal carotid diameter as the denominator. Multiphase CT imaging of the brain was performed following IV bolus contrast injection. Subsequent parametric perfusion maps were calculated using RAPID software. RADIATION DOSE REDUCTION: This exam was performed according to the departmental dose-optimization program which includes automated exposure control, adjustment of the mA and/or kV according to patient size and/or use of iterative reconstruction technique. CONTRAST:  OMNIPAQUE IOHEXOL 350 MG/ML SOLN COMPARISON:  CT head 04/17/2022 FINDINGS: CTA NECK FINDINGS Aortic arch: Standard branching. Imaged portion shows no evidence of aneurysm or dissection. No significant stenosis of the major arch vessel  origins. Right carotid system: Mild  atherosclerotic disease right carotid bifurcation. Negative for stenosis. Left carotid system: Mild atherosclerotic disease left carotid bifurcation. Negative for stenosis. Vertebral arteries: Right vertebral artery dominant. Both vertebral arteries patent to the skull base without stenosis. Skeleton: Cervical spondylosis.  No acute skeletal abnormality. Other neck: 2.2 cm right thyroid nodule. Upper chest: Lung apices clear bilaterally Review of the MIP images confirms the above findings CTA HEAD FINDINGS Anterior circulation: Internal carotid artery widely patent bilaterally with mild atherosclerotic disease in the left cavernous carotid. Anterior and middle cerebral arteries patent without occlusion. Diffuse atherosclerotic disease in bilateral anterior and middle cerebral arteries including M1 segment bilaterally and M2 and M3 and A2 and A3 segments. Posterior circulation: Right vertebral artery patent to the basilar. Left vertebral artery ends in PICA. Basilar patent. Fetal origin right posterior cerebral artery. Diffuse atherosclerotic irregularity and moderate stenosis in the posterior cerebral arteries bilaterally. Venous sinuses: Limited venous contrast. Anatomic variants: None Review of the MIP images confirms the above findings CT Brain Perfusion Findings: ASPECTS: 7 CBF (<30%) Volume: 0mL Perfusion (Tmax>6.0s) volume: 38mL Mismatch Volume: Infarction Location:Superior cerebellum bilaterally. Left parietal lobe IMPRESSION: 1. CT head demonstrates hypodensity left basal ganglia compatible with acute infarct in lenticulostriate territory. This does not demonstrate ischemia or infarct on CT perfusion. This is likely due to timing with last seen normal 24 hours. Recommend follow-up MRI to evaluate the extent of infarct. 2. No significant carotid or vertebral artery stenosis in the neck. Mild atherosclerotic disease in the carotid bifurcation bilaterally. 3. Moderate intracranial atherosclerotic disease  with diffuse irregularity of the anterior, middle, and posterior cerebral arteries. No large vessel occlusion. Acute infarct left basal ganglia is due to occlusion of left lenticulostriate vessels which are not well seen on CTA. 4. 2.2 cm right thyroid nodule. Thyroid ultrasound recommended (ref: J Am Coll Radiol. 2015 Feb;12(2): 143-50). 5. These results were called by telephone at the time of interpretation on 04/17/2022 at 5:55 pm to provider [facility] , who verbally acknowledged these results. Electronically Signed   By: Marlan Palau M.D.   On: 04/17/2022 17:55   CT HEAD CODE STROKE WO CONTRAST  Result Date: 04/17/2022 CLINICAL DATA:  Code stroke.  Acute neuro deficit. EXAM: CT HEAD WITHOUT CONTRAST TECHNIQUE: Contiguous axial images were obtained from the base of the skull through the vertex without intravenous contrast. RADIATION DOSE REDUCTION: This exam was performed according to the departmental dose-optimization program which includes automated exposure control, adjustment of the mA and/or kV according to patient size and/or use of iterative reconstruction technique. COMPARISON:  None Available. FINDINGS: Brain: Ill-defined hypodensity left basal ganglia compatible with acute infarct. This involves the putamen and body of the caudate and internal capsule. No acute hemorrhage. No other acute infarct or mass lesion. Ventricle size mildly dilated due to atrophy. Vascular: No focal vascular hyperdensity. Arteries are diffusely mildly hyperdense Skull: Negative Sinuses/Orbits: Mucosal edema throughout the paranasal sinuses with air-fluid level right maxillary sinus. Negative orbit Other: None ASPECTS (Alberta Stroke Program Early CT Score) - Ganglionic level infarction (caudate, lentiform nuclei, internal capsule, insula, M1-M3 cortex): 4 - Supraganglionic infarction (M4-M6 cortex): 3 Total score (0-10 with 10 being normal): 80 IMPRESSION: 1. Acute infarct left basal ganglia.  Negative for hemorrhage  2. ASPECTS is 7 3. Code stroke imaging results were communicated on 04/17/2022 at 5:27 pm to provider Selina Cooley via text page Electronically Signed   By: Marlan Palau M.D.   On: 04/17/2022 17:29  Subjective: No acute issues or events overnight   Discharge Exam: Vitals:   04/27/22 0738 04/27/22 1110  BP: 125/66 113/62  Pulse: 81 72  Resp: 20 17  Temp: 98 F (36.7 C) 97.7 F (36.5 C)  SpO2: 97% 97%   Vitals:   04/27/22 0335 04/27/22 0343 04/27/22 0738 04/27/22 1110  BP: 123/61  125/66 113/62  Pulse: 81  81 72  Resp: Temp: 98 F (36.7 C)  98 F (36.7 C) 97.7 F (36.5 C)  TempSrc: Oral  Oral Oral  SpO2: 96%  97% 97%  Weight:  85.1 kg    Height:        General: Pt is alert, awake, not in acute distress Cardiovascular: RRR, S1/S2 +, no rubs, no gallops Respiratory: CTA bilaterally, no wheezing, no rhonchi Abdominal: Soft, NT, ND, bowel sounds + Extremities: no edema, no cyanosis-right-sided weakness, 1 out of 5 strength    The results of significant diagnostics from this hospitalization (including imaging, microbiology, ancillary and laboratory) are listed below for reference.     Microbiology: Recent Results (from the past 240 hour(s))  SARS Coronavirus 2 by RT PCR (hospital order, performed in [facility] hospital lab) *cepheid single result test* Anterior Nasal Swab     Status: None   Collection Time: 04/17/22  9:20 PM   Specimen: Anterior Nasal Swab  Result Value Ref Range Status   SARS Coronavirus 2 by RT PCR NEGATIVE NEGATIVE Final    Comment: (NOTE) SARS-CoV-2 target nucleic acids are NOT DETECTED.  The SARS-CoV-2 RNA is generally detectable in upper and lower respiratory specimens during the acute phase of infection. The lowest concentration of SARS-CoV-2 viral copies this assay can detect is 250 copies / mL. A negative result does not preclude SARS-CoV-2 infection and should not be used as the sole basis for treatment or other patient management  decisions.  A negative result may occur with improper specimen collection / handling, submission of specimen other than nasopharyngeal swab, presence of viral mutation(s) within the areas targeted by this assay, and inadequate number of viral copies (<250 copies / mL). A negative result must be combined with clinical observations, patient history, and epidemiological information.  Fact Sheet for Patients:   RoadLapTop.co.za  Fact Sheet for Healthcare Providers: http://kim-miller.com/  This test is not yet approved or  cleared by the [place] FDA and has been authorized for detection and/or diagnosis of SARS-CoV-2 by FDA under an Emergency Use Authorization (EUA).  This EUA will remain in effect (meaning this test can be used) for the duration of the COVID-19 declaration under Section 564(b)(1) of the Act, 21 U.S.C. section 360bbb-3(b)(1), unless the authorization is terminated or revoked sooner.  Performed at [facility], [address] [place], [place] [address]   Urine Culture     Status: None   Collection Time: 04/18/22  2:47 AM   Specimen: In/Out Cath Urine  Result Value Ref Range Status   Specimen Description IN/OUT CATH URINE  Final   Special Requests NONE  Final   Culture   Final    NO GROWTH Performed at [facility], [address], [place], [place] [address]    Report Status 04/19/2022 FINAL  Final  Culture, blood (Routine X 2) w Reflex to ID Panel     Status: None   Collection Time: 04/18/22  3:54 AM   Specimen: BLOOD RIGHT HAND  Result Value Ref Range Status   Specimen Description BLOOD  RIGHT HAND  Final   Special Requests   Final    BOTTLES DRAWN AEROBIC AND ANAEROBIC Blood Culture adequate volume   Culture   Final    NO GROWTH 5 DAYS Performed at [facility], [address] [place], [place] [address]    Report Status 04/23/2022 FINAL  Final  Culture, blood (Routine X 2) w Reflex to ID  Panel     Status: None   Collection Time: 04/18/22  3:54 AM   Specimen: BLOOD  Result Value Ref Range Status   Specimen Description BLOOD RIGHT ANTECUBITAL  Final   Special Requests   Final    BOTTLES DRAWN AEROBIC AND ANAEROBIC Blood Culture adequate volume   Culture   Final    NO GROWTH 5 DAYS Performed at [facility], [address] [place], [place] [address]    Report Status 04/23/2022 FINAL  Final     Labs: BNP (last 3 results) No results for input(s): "BNP" in the last 8760 hours. Basic Metabolic Panel: Recent Labs  Lab 04/20/22 1817 04/21/22 0436 04/21/22 1754 04/22/22 0355 04/23/22 0457 04/25/22 0604  NA  --   --   --   --  143 140  K  --   --   --   --  3.6 3.8  CL  --   --   --   --  111 108  CO2  --   --   --   --  24 24  GLUCOSE  --   --   --   --  157* 127*  BUN  --   --   --   --  31* 24*  CREATININE  --   --   --   --  0.96 0.98  CALCIUM  --   --   --   --  8.5* 8.3*  MG 2.9* 2.9* 2.6* 2.5* 2.2  --   PHOS 2.5  --   --   --   --   --    Liver Function Tests: Recent Labs  Lab 04/25/22 0604  AST 44*  ALT 66*  ALKPHOS 47  BILITOT 1.0  PROT 5.8*  ALBUMIN 2.6*   No results for input(s): "LIPASE", "AMYLASE" in the last 168 hours. No results for input(s): "AMMONIA" in the last 168 hours. CBC: Recent Labs  Lab 04/21/22 0436 04/22/22 0355 04/23/22 0457  WBC 10.2 13.1* 14.5*  NEUTROABS 6.8 8.7*  --   HGB 16.6 15.8 13.9  HCT 52.1* 49.6 44.0  MCV 92.0 92.4 91.1  PLT 248 223 200   Cardiac Enzymes: No results for input(s): "CKTOTAL", "CKMB", "CKMBINDEX", "TROPONINI" in the last 168 hours. BNP: Invalid input(s): "POCBNP" CBG: Recent Labs  Lab 04/26/22 1207 04/26/22 1605 04/26/22 2127 04/27/22 0620 04/27/22 1126  GLUCAP 161* 109* 144* 162* 226*   D-Dimer No results for input(s): "DDIMER" in the last 72 hours. Hgb A1c No results for input(s): "HGBA1C" in the last 72 hours. Lipid Profile No results for input(s): "CHOL", "HDL",  "LDLCALC", "TRIG", "CHOLHDL", "LDLDIRECT" in the last 72 hours. Thyroid function studies No results for input(s): "TSH", "T4TOTAL", "T3FREE", "THYROIDAB" in the last 72 hours.  Invalid input(s): "FREET3" Anemia work up No results for input(s): "VITAMINB12", "FOLATE", "FERRITIN", "TIBC", "IRON", "RETICCTPCT" in the last 72 hours. Urinalysis    Component Value Date/Time   COLORURINE AMBER (A) 04/18/2022 0246   APPEARANCEUR CLOUDY (A) 04/18/2022 0246   LABSPEC >1.046 (H) 04/18/2022 0246  PHURINE 5.0 04/18/2022 0246   GLUCOSEU NEGATIVE 04/18/2022 0246   HGBUR LARGE (A) 04/18/2022 0246   BILIRUBINUR NEGATIVE 04/18/2022 0246   KETONESUR NEGATIVE 04/18/2022 0246   PROTEINUR >=300 (A) 04/18/2022 0246   NITRITE NEGATIVE 04/18/2022 0246   LEUKOCYTESUR NEGATIVE 04/18/2022 0246   Sepsis Labs Recent Labs  Lab 04/21/22 0436 04/22/22 0355 04/23/22 0457  WBC 10.2 13.1* 14.5*   Microbiology Recent Results (from the past 240 hour(s))  SARS Coronavirus 2 by RT PCR (hospital order, performed in [facility] hospital lab) *cepheid single result test* Anterior Nasal Swab     Status: None   Collection Time: 04/17/22  9:20 PM   Specimen: Anterior Nasal Swab  Result Value Ref Range Status   SARS Coronavirus 2 by RT PCR NEGATIVE NEGATIVE Final    Comment: (NOTE) SARS-CoV-2 target nucleic acids are NOT DETECTED.  The SARS-CoV-2 RNA is generally detectable in upper and lower respiratory specimens during the acute phase of infection. The lowest concentration of SARS-CoV-2 viral copies this assay can detect is 250 copies / mL. A negative result does not preclude SARS-CoV-2 infection and should not be used as the sole basis for treatment or other patient management decisions.  A negative result may occur with improper specimen collection / handling, submission of specimen other than nasopharyngeal swab, presence of viral mutation(s) within the areas targeted by this assay, and inadequate number of  viral copies (<250 copies / mL). A negative result must be combined with clinical observations, patient history, and epidemiological information.  Fact Sheet for Patients:   RoadLapTop.co.za  Fact Sheet for Healthcare Providers: http://kim-miller.com/  This test is not yet approved or  cleared by the [place] FDA and has been authorized for detection and/or diagnosis of SARS-CoV-2 by FDA under an Emergency Use Authorization (EUA).  This EUA will remain in effect (meaning this test can be used) for the duration of the COVID-19 declaration under Section 564(b)(1) of the Act, 21 U.S.C. section 360bbb-3(b)(1), unless the authorization is terminated or revoked sooner.  Performed at [facility], [address] [place], [place] [address]   Urine Culture     Status: None   Collection Time: 04/18/22  2:47 AM   Specimen: In/Out Cath Urine  Result Value Ref Range Status   Specimen Description IN/OUT CATH URINE  Final   Special Requests NONE  Final   Culture   Final    NO GROWTH Performed at [facility], [address], [place], [place] [address]    Report Status 04/19/2022 FINAL  Final  Culture, blood (Routine X 2) w Reflex to ID Panel     Status: None   Collection Time: 04/18/22  3:54 AM   Specimen: BLOOD RIGHT HAND  Result Value Ref Range Status   Specimen Description BLOOD RIGHT HAND  Final   Special Requests   Final    BOTTLES DRAWN AEROBIC AND ANAEROBIC Blood Culture adequate volume   Culture   Final    NO GROWTH 5 DAYS Performed at [facility], [address], [place], [place] [address]    Report Status 04/23/2022 FINAL  Final  Culture, blood (Routine X 2) w Reflex to ID Panel     Status: None   Collection Time: 04/18/22  3:54 AM   Specimen: BLOOD  Result Value Ref Range Status   Specimen Description BLOOD RIGHT ANTECUBITAL  Final   Special Requests   Final    BOTTLES DRAWN AEROBIC AND ANAEROBIC  Blood  Culture adequate volume   Culture   Final    NO GROWTH 5 DAYS Performed at [facility], [address], [place], [place] [address]    Report Status 04/23/2022 FINAL  Final     Time coordinating discharge: Over 30 minutes  SIGNED:   Azucena Fallen, DO [facility] 04/27/2022, 1:02 PM Pager   If 7PM-7AM, please contact night-coverage [email or web address]

## 2022-04-27 NOTE — TOC Transition Note (Signed)
Transition of Care [facility]) - CM/SW Discharge Note   Patient Details  Name: Alex Grant MRN: [id number] Date of Birth: 06/22/1966  Transition of Care [facility]) CM/SW Contact:  Lynett Grimes Phone Number: 04/27/2022, 12:41 PM   Clinical Narrative:    Patient will DC to: [facility] Anticipated DC date: 04/27/2022 Family notified: Pt Stepmother Transport by: Sharin Mons   Per MD patient ready for DC to [facility]. RN to call report prior to discharge [phone]). RN, patient, patient's family, and facility notified of DC. Discharge Summary and FL2 sent to facility. DC packet on chart. Ambulance transport requested for patient.   CSW will sign off for now as social work intervention is no longer needed. Please consult [place] again if new needs arise.       Barriers to Discharge: Continued Medical Work up, English as a second language teacher   Patient Goals and CMS Choice Patient states their goals for this hospitalization and ongoing recovery are:: patient unable to participate in goal setting CMS Medicare.gov Compare Post Acute Care list provided to:: Patient Represenative (must comment) Choice offered to / list presented to : Parent  Discharge Placement                       Discharge Plan and Services     Post Acute Care Choice: Skilled Nursing Facility                               Social Determinants of Health (SDOH) Interventions     Readmission Risk Interventions     No data to display

## 2022-04-27 NOTE — Progress Notes (Signed)
Wheeled off unit by [employer]

## 2022-04-29 DIAGNOSIS — I69351 Hemiplegia and hemiparesis following cerebral infarction affecting right dominant side: Secondary | ICD-10-CM | POA: Diagnosis not present

## 2022-04-29 DIAGNOSIS — I69920 Aphasia following unspecified cerebrovascular disease: Secondary | ICD-10-CM | POA: Diagnosis not present

## 2022-04-29 DIAGNOSIS — E785 Hyperlipidemia, unspecified: Secondary | ICD-10-CM | POA: Diagnosis not present

## 2022-04-29 DIAGNOSIS — I69921 Dysphasia following unspecified cerebrovascular disease: Secondary | ICD-10-CM | POA: Diagnosis not present

## 2022-05-03 DIAGNOSIS — I69921 Dysphasia following unspecified cerebrovascular disease: Secondary | ICD-10-CM | POA: Diagnosis not present

## 2022-05-03 DIAGNOSIS — I69920 Aphasia following unspecified cerebrovascular disease: Secondary | ICD-10-CM | POA: Diagnosis not present

## 2022-05-03 DIAGNOSIS — I69351 Hemiplegia and hemiparesis following cerebral infarction affecting right dominant side: Secondary | ICD-10-CM | POA: Diagnosis not present

## 2022-05-03 DIAGNOSIS — E785 Hyperlipidemia, unspecified: Secondary | ICD-10-CM | POA: Diagnosis not present

## 2022-05-06 DIAGNOSIS — I69920 Aphasia following unspecified cerebrovascular disease: Secondary | ICD-10-CM | POA: Diagnosis not present

## 2022-05-06 DIAGNOSIS — I69921 Dysphasia following unspecified cerebrovascular disease: Secondary | ICD-10-CM | POA: Diagnosis not present

## 2022-05-06 DIAGNOSIS — E785 Hyperlipidemia, unspecified: Secondary | ICD-10-CM | POA: Diagnosis not present

## 2022-05-06 DIAGNOSIS — I69351 Hemiplegia and hemiparesis following cerebral infarction affecting right dominant side: Secondary | ICD-10-CM | POA: Diagnosis not present

## 2022-05-10 DIAGNOSIS — I69920 Aphasia following unspecified cerebrovascular disease: Secondary | ICD-10-CM | POA: Diagnosis not present

## 2022-05-10 DIAGNOSIS — I69921 Dysphasia following unspecified cerebrovascular disease: Secondary | ICD-10-CM | POA: Diagnosis not present

## 2022-05-10 DIAGNOSIS — I69351 Hemiplegia and hemiparesis following cerebral infarction affecting right dominant side: Secondary | ICD-10-CM | POA: Diagnosis not present

## 2022-05-10 DIAGNOSIS — E785 Hyperlipidemia, unspecified: Secondary | ICD-10-CM | POA: Diagnosis not present

## 2022-05-13 DIAGNOSIS — I69920 Aphasia following unspecified cerebrovascular disease: Secondary | ICD-10-CM | POA: Diagnosis not present

## 2022-05-13 DIAGNOSIS — I69351 Hemiplegia and hemiparesis following cerebral infarction affecting right dominant side: Secondary | ICD-10-CM | POA: Diagnosis not present

## 2022-05-13 DIAGNOSIS — I69921 Dysphasia following unspecified cerebrovascular disease: Secondary | ICD-10-CM | POA: Diagnosis not present

## 2022-05-13 DIAGNOSIS — E785 Hyperlipidemia, unspecified: Secondary | ICD-10-CM | POA: Diagnosis not present

## 2022-05-17 DIAGNOSIS — I69921 Dysphasia following unspecified cerebrovascular disease: Secondary | ICD-10-CM | POA: Diagnosis not present

## 2022-05-17 DIAGNOSIS — I69351 Hemiplegia and hemiparesis following cerebral infarction affecting right dominant side: Secondary | ICD-10-CM | POA: Diagnosis not present

## 2022-05-17 DIAGNOSIS — I69920 Aphasia following unspecified cerebrovascular disease: Secondary | ICD-10-CM | POA: Diagnosis not present

## 2022-05-17 DIAGNOSIS — E785 Hyperlipidemia, unspecified: Secondary | ICD-10-CM | POA: Diagnosis not present

## 2022-05-20 DIAGNOSIS — I69920 Aphasia following unspecified cerebrovascular disease: Secondary | ICD-10-CM | POA: Diagnosis not present

## 2022-05-20 DIAGNOSIS — I69351 Hemiplegia and hemiparesis following cerebral infarction affecting right dominant side: Secondary | ICD-10-CM | POA: Diagnosis not present

## 2022-05-20 DIAGNOSIS — I69921 Dysphasia following unspecified cerebrovascular disease: Secondary | ICD-10-CM | POA: Diagnosis not present

## 2022-05-20 DIAGNOSIS — E785 Hyperlipidemia, unspecified: Secondary | ICD-10-CM | POA: Diagnosis not present

## 2022-05-24 DIAGNOSIS — I69921 Dysphasia following unspecified cerebrovascular disease: Secondary | ICD-10-CM | POA: Diagnosis not present

## 2022-05-24 DIAGNOSIS — I69351 Hemiplegia and hemiparesis following cerebral infarction affecting right dominant side: Secondary | ICD-10-CM | POA: Diagnosis not present

## 2022-05-24 DIAGNOSIS — I69920 Aphasia following unspecified cerebrovascular disease: Secondary | ICD-10-CM | POA: Diagnosis not present

## 2022-05-24 DIAGNOSIS — E785 Hyperlipidemia, unspecified: Secondary | ICD-10-CM | POA: Diagnosis not present

## 2022-05-27 DIAGNOSIS — E785 Hyperlipidemia, unspecified: Secondary | ICD-10-CM | POA: Diagnosis not present

## 2022-05-27 DIAGNOSIS — I69351 Hemiplegia and hemiparesis following cerebral infarction affecting right dominant side: Secondary | ICD-10-CM | POA: Diagnosis not present

## 2022-05-27 DIAGNOSIS — I69921 Dysphasia following unspecified cerebrovascular disease: Secondary | ICD-10-CM | POA: Diagnosis not present

## 2022-05-27 DIAGNOSIS — I69920 Aphasia following unspecified cerebrovascular disease: Secondary | ICD-10-CM | POA: Diagnosis not present

## 2022-05-31 DIAGNOSIS — E785 Hyperlipidemia, unspecified: Secondary | ICD-10-CM | POA: Diagnosis not present

## 2022-05-31 DIAGNOSIS — I69351 Hemiplegia and hemiparesis following cerebral infarction affecting right dominant side: Secondary | ICD-10-CM | POA: Diagnosis not present

## 2022-05-31 DIAGNOSIS — I69921 Dysphasia following unspecified cerebrovascular disease: Secondary | ICD-10-CM | POA: Diagnosis not present

## 2022-05-31 DIAGNOSIS — I69920 Aphasia following unspecified cerebrovascular disease: Secondary | ICD-10-CM | POA: Diagnosis not present

## 2022-06-09 NOTE — Progress Notes (Unsigned)
[facility] [address] [place]. [place] [id number] [phone]       HOSPITAL FOLLOW UP NOTE  [name] Loss Date of Birth:  1966-02-21 Medical Record Number:  [id number]   Reason for Referral:  hospital stroke follow up    SUBJECTIVE:   CHIEF COMPLAINT:  No chief complaint on file.   HPI:   [name] Copes is a 56 y.o. male with history of hyperlipidemia and hypertension who presented on 04/17/2022 after being found down at home with right sided hemiplegia, left gaze deviation and aphasia as well as hypertensive urgency.  Personally reviewed hospitalization pertinent progress notes, lab work and imaging.  Evaluated by Dr. Roda ShuttersXu for left BG/CR large infarct likely secondary to small vessel disease given uncontrolled risk factors however cardioembolic source cannot be completely ruled out due to size.  Recommended 30-day CardioNet monitor outpatient to rule out A-fib.  CTA head/neck negative LVO.  EF 65 to 70%.  LDL 217.  A1c 5.9.  Noncompliant with aspirin PTA, recommended DAPT for 3 weeks and aspirin alone as well as initiating Crestor 40 mg daily. BG gradually improved and placed on amlodipine and metoprolol with long-term BP goal normotensive range.  Evidence of rhabdomyolysis, AKI, dehydration, transaminitis,  leukocytosis and hypernatremia, treated with IV fluids and antibiotics with improvement.  Incidental finding of right thyroid nodule on imaging and recommend outpatient follow-up with PCP.  Residual deficits of dysphagia (Dys 1 HTL diet), dense expressive aphasia and right hemiplegia.  Therapies initially recommended CIR but due to lack of caregiver support recommended discharge to SNF.  Today, 06/10/2022, patient is being seen for initial hospital follow-up.    Completed 3 weeks DAPT, remains on aspirin alone as well as Crestor, denies side effects Blood pressure today ***       PERTINENT IMAGING  Per hospitalization 04/17/2022 - *** Code Stroke  CT head Acute left basal ganglia infarct ASPECTS 7   CTA head & neck No LVO or hemodynamically significant stenosis CT perfusion infarct not seen, likely due to it being 24 hours old MRI acute perforator infarct in left basal ganglia and corona radiata, chronic small vessel ischemia 2D Echo EF 65-70%  Consider 30-day cardiac event monitoring as outpatient to rule out A-fib LDL 217 HgbA1c 5.9 UDS neg    ROS:   14 system review of systems performed and negative with exception of ***  PMH:  Past Medical History:  Diagnosis Date   High cholesterol    Hypertension     PSH:  Past Surgical History:  Procedure Laterality Date   NO PAST SURGERIES      Social History:  Social History   Socioeconomic History   Marital status: Single    Spouse name: Not on file   Number of children: Not on file   Years of education: Not on file   Highest education level: Not on file  Occupational History   Not on file  Tobacco Use   Smoking status: Never   Smokeless tobacco: Never  Substance and Sexual Activity   Alcohol use: No   Drug use: No   Sexual activity: Not on file  Other Topics Concern   Not on file  Social History Narrative   Not on file   Social Determinants of Health   Financial Resource Strain: Not on file  Food Insecurity: Not on file  Transportation Needs: Not on file  Physical Activity: Not on file  Stress: Not on file  Social Connections: Not on file  Intimate Partner Violence: Not on file    Family History:  Family History  Problem Relation Age of Onset   Diabetes Mellitus II Father    CAD Other     Medications:   Current Outpatient Medications on File Prior to Visit  Medication Sig Dispense Refill   amLODipine (NORVASC) 10 MG tablet Take 1 tablet (10 mg total) by mouth daily. 30 tablet 0   aspirin EC 81 MG tablet Take 81 mg by mouth daily.     carvedilol (COREG) 25 MG tablet Take 1 tablet (25 mg total) by mouth 2 (two) times daily with a meal. 60  tablet 0   irbesartan (AVAPRO) 75 MG tablet Take 1 tablet (75 mg total) by mouth daily. 30 tablet 0   rosuvastatin (CRESTOR) 40 MG tablet Take 1 tablet (40 mg total) by mouth daily at 6 PM. 30 tablet 2   No current facility-administered medications on file prior to visit.    Allergies:   Allergies  Allergen Reactions   Amoxil [Amoxicillin] Hives   Atorvastatin     Other reaction(s): knee pain   Lisinopril     Other reaction(s): cough      OBJECTIVE:  Physical Exam  There were no vitals filed for this visit. There is no height or weight on file to calculate BMI. No results found.      No data to display           General: well developed, well nourished, seated, in no evident distress Head: head normocephalic and atraumatic.   Neck: supple with no carotid or supraclavicular bruits Cardiovascular: regular rate and rhythm, no murmurs Musculoskeletal: no deformity Skin:  no rash/petichiae Vascular:  Normal pulses all extremities   Neurologic Exam Mental Status: Awake and fully alert. Oriented to place and time. Recent and remote memory intact. Attention span, concentration and fund of knowledge appropriate. Mood and affect appropriate.  Cranial Nerves: Fundoscopic exam reveals sharp disc margins. Pupils equal, briskly reactive to light. Extraocular movements full without nystagmus. Visual fields full to confrontation. Hearing intact. Facial sensation intact. Face, tongue, palate moves normally and symmetrically.  Motor: Normal bulk and tone. Normal strength in all tested extremity muscles Sensory.: intact to touch , pinprick , position and vibratory sensation.  Coordination: Rapid alternating movements normal in all extremities. Finger-to-nose and heel-to-shin performed accurately bilaterally. Gait and Station: Arises from chair without difficulty. Stance is normal. Gait demonstrates normal stride length and balance with ***. Tandem walk and heel toe ***.  Reflexes: 1+ and  symmetric. Toes downgoing.     NIHSS  *** Modified Rankin  ***      ASSESSMENT: Alex Grant is a 56 y.o. 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 Vascular risk factors include uncontrolled HTN, uncontrolled HLD, pre-DM, noncompliant with medication and poor diet.      PLAN:  Large left BG/CR stroke:  Residual deficit: ***.  Continue aspirin 81 mg daily  and Crestor 40 mg daily for secondary stroke prevention.  Discussed importance of medication compliance and use of these medications lifelong unless contraindicated in the future Discussed secondary stroke prevention measures and importance of close PCP follow up for aggressive stroke risk factor management including BP goal<130/90, HLD with LDL goal<70 and pre-DM with A1c.<7  Stroke labs 03/2022: LDL 217, A1c 5.9 - repeat today I have gone over the pathophysiology of  stroke, warning signs and symptoms, risk factors and their management in some detail with instructions to go to the closest emergency room for symptoms of concern. Evidence of rhabdomyolysis, AKI, dehydration, transaminitis,hypernatremia and leukocytosis on admission with gradual improvement during hospitalization.  Repeat lab work today to ensure satisfactory levels    Follow up in *** or call earlier if needed   CC:  [facility] provider: Dr. Pearlean Brownie PCP: Patient, No Pcp Per    I spent *** minutes of face-to-face and non-face-to-face time with patient.  This included previsit chart review including review of recent hospitalization, lab review, study review, order entry, electronic health record documentation, patient education regarding recent stroke including etiology, secondary stroke prevention measures and importance of managing stroke risk factors, residual deficits and typical recovery time and answered all other  questions to patient satisfaction   Ihor Austin, AGNP-BC  [facility] [address] [place], [place] [address]  Phone [phone] Fax [phone] Note: This document was prepared with digital dictation and possible smart phrase technology. Any transcriptional errors that result from this process are unintentional.

## 2022-06-10 ENCOUNTER — Ambulatory Visit (INDEPENDENT_AMBULATORY_CARE_PROVIDER_SITE_OTHER): Payer: BC Managed Care – PPO | Admitting: Adult Health

## 2022-06-10 ENCOUNTER — Encounter: Payer: Self-pay | Admitting: Adult Health

## 2022-06-10 VITALS — BP 118/62 | HR 58 | Ht 68.0 in

## 2022-06-10 DIAGNOSIS — I6381 Other cerebral infarction due to occlusion or stenosis of small artery: Secondary | ICD-10-CM | POA: Diagnosis not present

## 2022-06-10 DIAGNOSIS — G8111 Spastic hemiplegia affecting right dominant side: Secondary | ICD-10-CM | POA: Diagnosis not present

## 2022-06-10 DIAGNOSIS — F32A Depression, unspecified: Secondary | ICD-10-CM | POA: Diagnosis not present

## 2022-06-10 DIAGNOSIS — E785 Hyperlipidemia, unspecified: Secondary | ICD-10-CM | POA: Diagnosis not present

## 2022-06-10 DIAGNOSIS — M6282 Rhabdomyolysis: Secondary | ICD-10-CM

## 2022-06-10 DIAGNOSIS — Z09 Encounter for follow-up examination after completed treatment for conditions other than malignant neoplasm: Secondary | ICD-10-CM

## 2022-06-10 DIAGNOSIS — F5102 Adjustment insomnia: Secondary | ICD-10-CM | POA: Diagnosis not present

## 2022-06-10 DIAGNOSIS — R7303 Prediabetes: Secondary | ICD-10-CM

## 2022-06-10 MED ORDER — BACLOFEN 5 MG PO TABS: ORAL_TABLET | ORAL | 5 refills | Status: DC

## 2022-06-10 NOTE — Patient Instructions (Signed)
Continue working with therapies for hopeful ongoing recovery  Start baclofen 5mg  nightly and after 1 week, increase to 5mg  twice daily - further titration can be managed by facility   Repeat lab work today including CK, liver function tests, A1c and lipid panel and clotting factor labs  Order placed to complete 30 day cardiac monitor to rule out atrial fibrillation   Continue aspirin 81 mg daily  and Crestor for secondary stroke prevention  Continue to follow up with PCP regarding cholesterol and blood pressure management  Maintain strict control of hypertension with blood pressure goal below 130/90 and cholesterol with LDL cholesterol (bad cholesterol) goal below 70 mg/dL.   Signs of a Stroke? Follow the BEFAST method:  Balance Watch for a sudden loss of balance, trouble with coordination or vertigo Eyes Is there a sudden loss of vision in one or both eyes? Or double vision?  Face: Ask the person to smile. Does one side of the face droop or is it numb?  Arms: Ask the person to raise both arms. Does one arm drift downward? Is there weakness or numbness of a leg? Speech: Ask the person to repeat a simple phrase. Does the speech sound slurred/strange? Is the person confused ? Time: If you observe any of these signs, call 911.     Followup in the future with me in 4 months or call earlier if needed       Thank you for coming to see at [facility]. I hope we have been able to provide you high quality care today.  You may receive a patient satisfaction survey over the next few weeks. We would appreciate your feedback and comments so that we may continue to improve ourselves and the health of our patients.

## 2022-06-11 ENCOUNTER — Telehealth: Payer: Self-pay

## 2022-06-11 NOTE — Telephone Encounter (Signed)
I called pt's Brother to discuss the Event Monitor that was ordered. He is at [facility]. I called them and spoke with his RN, Rolly Salter. She is aware of the monitor being mailed to him there and agreed that she would keep a look out for it and help him with it.  I verified his Insurance with his Brother.   Address monitor was mailed to: [facility] [address] [place], [place] [address]

## 2022-06-11 NOTE — Progress Notes (Signed)
I agree with the above plan 

## 2022-06-18 ENCOUNTER — Ambulatory Visit: Payer: BC Managed Care – PPO | Attending: Adult Health

## 2022-06-18 DIAGNOSIS — I6381 Other cerebral infarction due to occlusion or stenosis of small artery: Secondary | ICD-10-CM | POA: Diagnosis not present

## 2022-06-18 DIAGNOSIS — I639 Cerebral infarction, unspecified: Secondary | ICD-10-CM | POA: Diagnosis not present

## 2022-06-18 DIAGNOSIS — I4891 Unspecified atrial fibrillation: Secondary | ICD-10-CM

## 2022-06-19 LAB — FACTOR 5 LEIDEN

## 2022-06-19 LAB — LUPUS ANTICOAGULANT
Dilute Viper Venom Time: 37.8 s (ref 0.0–47.0)
PTT Lupus Anticoagulant: 39.8 s (ref 0.0–43.5)
Thrombin Time: 16.7 s (ref 0.0–23.0)
dPT Confirm Ratio: 0.96 Ratio (ref 0.00–1.34)
dPT: 40.3 s (ref 0.0–47.6)

## 2022-06-19 LAB — COMPREHENSIVE METABOLIC PANEL
ALT: 24 IU/L (ref 0–44)
AST: 21 IU/L (ref 0–40)
Albumin/Globulin Ratio: 1.9 (ref 1.2–2.2)
Albumin: 4.8 g/dL (ref 3.8–4.9)
Alkaline Phosphatase: 87 IU/L (ref 44–121)
BUN/Creatinine Ratio: 18 (ref 9–20)
BUN: 13 mg/dL (ref 6–24)
Bilirubin Total: 0.4 mg/dL (ref 0.0–1.2)
CO2: 25 mmol/L (ref 20–29)
Calcium: 9.9 mg/dL (ref 8.7–10.2)
Chloride: 101 mmol/L (ref 96–106)
Creatinine, Ser: 0.74 mg/dL — ABNORMAL LOW (ref 0.76–1.27)
Globulin, Total: 2.5 g/dL (ref 1.5–4.5)
Glucose: 108 mg/dL — ABNORMAL HIGH (ref 70–99)
Potassium: 4.1 mmol/L (ref 3.5–5.2)
Sodium: 140 mmol/L (ref 134–144)
Total Protein: 7.3 g/dL (ref 6.0–8.5)
eGFR: 105 mL/min/{1.73_m2} (ref >59)

## 2022-06-19 LAB — LIPID PANEL
Chol/HDL Ratio: 3.1 ratio (ref 0.0–5.0)
Cholesterol, Total: 113 mg/dL (ref 100–199)
HDL: 36 mg/dL — ABNORMAL LOW (ref >39)
LDL Chol Calc (NIH): 43 mg/dL (ref 0–99)
Triglycerides: 210 mg/dL — ABNORMAL HIGH (ref 0–149)
VLDL Cholesterol Cal: 34 mg/dL (ref 5–40)

## 2022-06-19 LAB — CARDIOLIPIN ANTIBODIES, IGG, IGM, IGA
Anticardiolipin IgA: <9 APL U/mL (ref 0–11)
Anticardiolipin IgG: <9 GPL U/mL (ref 0–14)
Anticardiolipin IgM: <9 MPL U/mL (ref 0–12)

## 2022-06-19 LAB — CBC
Hematocrit: 41.8 % (ref 37.5–51.0)
Hemoglobin: 13.2 g/dL (ref 13.0–17.7)
MCH: 28.9 pg (ref 26.6–33.0)
MCHC: 31.6 g/dL (ref 31.5–35.7)
MCV: 92 fL (ref 79–97)
Platelets: 314 10*3/uL (ref 150–450)
RBC: 4.56 x10E6/uL (ref 4.14–5.80)
RDW: 13.4 % (ref 11.6–15.4)
WBC: 9.5 10*3/uL (ref 3.4–10.8)

## 2022-06-19 LAB — PROTEIN S PANEL
Protein S Activity: 63 % (ref 63–140)
Protein S Ag, Free: 100 % (ref 61–136)
Protein S Ag, Total: 116 % (ref 60–150)

## 2022-06-19 LAB — HEMOGLOBIN A1C
Est. average glucose Bld gHb Est-mCnc: 117 mg/dL
Hgb A1c MFr Bld: 5.7 % — ABNORMAL HIGH (ref 4.8–5.6)

## 2022-06-19 LAB — PROTEIN C ACTIVITY: Protein C Activity: 139 % (ref 73–180)

## 2022-06-19 LAB — CK: Total CK: 41 U/L (ref 41–331)

## 2022-06-22 ENCOUNTER — Telehealth: Payer: Self-pay

## 2022-06-22 NOTE — Telephone Encounter (Signed)
Called pt LVM to call back and discuss results

## 2022-06-22 NOTE — Telephone Encounter (Signed)
-----   Message from Ihor Austin, NP sent at 06/22/2022  7:05 AM EDT ----- Please advise patient that his lab work was largely satisfactory but did show 1 abnormal clotting factor, factor V Leiden.  Please advise patient this does not typically cause arterial clots (clots that would cause a stroke) but if there is any history of DVT or PE in the past he will need to be on lifelong anticoagulation.  He may benefit from referral to hematology for further counseling, education and understanding risk -please let me know if he is interested in referral will be placed.

## 2022-06-23 NOTE — Telephone Encounter (Signed)
Contacted pt, LVM per DPR, informing him of results. Number provided to [facility] with questions or if he would like referral placed.

## 2022-07-08 DIAGNOSIS — F32A Depression, unspecified: Secondary | ICD-10-CM | POA: Diagnosis not present

## 2022-07-08 DIAGNOSIS — F5102 Adjustment insomnia: Secondary | ICD-10-CM | POA: Diagnosis not present

## 2022-07-20 DIAGNOSIS — F32A Depression, unspecified: Secondary | ICD-10-CM | POA: Diagnosis not present

## 2022-07-20 DIAGNOSIS — F5102 Adjustment insomnia: Secondary | ICD-10-CM | POA: Diagnosis not present

## 2022-07-21 ENCOUNTER — Other Ambulatory Visit: Payer: Self-pay | Admitting: Adult Health

## 2022-07-21 DIAGNOSIS — I6381 Other cerebral infarction due to occlusion or stenosis of small artery: Secondary | ICD-10-CM

## 2022-07-21 DIAGNOSIS — I639 Cerebral infarction, unspecified: Secondary | ICD-10-CM

## 2022-07-21 DIAGNOSIS — I4891 Unspecified atrial fibrillation: Secondary | ICD-10-CM

## 2022-07-22 ENCOUNTER — Telehealth: Payer: Self-pay | Admitting: *Deleted

## 2022-07-22 NOTE — Telephone Encounter (Signed)
Attempted to reach patient x 2 with only # available. Message: your call cannot be completed as dialed, try again later.

## 2022-07-22 NOTE — Telephone Encounter (Signed)
Unable to reach patient again. Called father on D{PR, LVM requesting call back for results on patient.

## 2022-07-28 ENCOUNTER — Encounter: Payer: Self-pay | Admitting: *Deleted

## 2022-08-03 DIAGNOSIS — F32A Depression, unspecified: Secondary | ICD-10-CM | POA: Diagnosis not present

## 2022-08-03 DIAGNOSIS — F5102 Adjustment insomnia: Secondary | ICD-10-CM | POA: Diagnosis not present

## 2022-08-13 DIAGNOSIS — F5102 Adjustment insomnia: Secondary | ICD-10-CM | POA: Diagnosis not present

## 2022-08-13 DIAGNOSIS — F32A Depression, unspecified: Secondary | ICD-10-CM | POA: Diagnosis not present

## 2022-08-31 DIAGNOSIS — F5102 Adjustment insomnia: Secondary | ICD-10-CM | POA: Diagnosis not present

## 2022-08-31 DIAGNOSIS — F32A Depression, unspecified: Secondary | ICD-10-CM | POA: Diagnosis not present

## 2022-09-07 DIAGNOSIS — F32A Depression, unspecified: Secondary | ICD-10-CM | POA: Diagnosis not present

## 2022-09-07 DIAGNOSIS — F5102 Adjustment insomnia: Secondary | ICD-10-CM | POA: Diagnosis not present

## 2022-09-21 DIAGNOSIS — F32A Depression, unspecified: Secondary | ICD-10-CM | POA: Diagnosis not present

## 2022-09-21 DIAGNOSIS — F5102 Adjustment insomnia: Secondary | ICD-10-CM | POA: Diagnosis not present

## 2022-09-28 DIAGNOSIS — F32A Depression, unspecified: Secondary | ICD-10-CM | POA: Diagnosis not present

## 2022-09-28 DIAGNOSIS — F5102 Adjustment insomnia: Secondary | ICD-10-CM | POA: Diagnosis not present

## 2022-09-30 DIAGNOSIS — I1310 Hypertensive heart and chronic kidney disease without heart failure, with stage 1 through stage 4 chronic kidney disease, or unspecified chronic kidney disease: Secondary | ICD-10-CM | POA: Diagnosis not present

## 2022-09-30 DIAGNOSIS — F5102 Adjustment insomnia: Secondary | ICD-10-CM | POA: Diagnosis not present

## 2022-09-30 DIAGNOSIS — M6281 Muscle weakness (generalized): Secondary | ICD-10-CM | POA: Diagnosis not present

## 2022-09-30 DIAGNOSIS — E785 Hyperlipidemia, unspecified: Secondary | ICD-10-CM | POA: Diagnosis not present

## 2022-09-30 DIAGNOSIS — F32A Depression, unspecified: Secondary | ICD-10-CM | POA: Diagnosis not present

## 2022-09-30 DIAGNOSIS — R7303 Prediabetes: Secondary | ICD-10-CM | POA: Diagnosis not present

## 2022-10-07 DIAGNOSIS — I69351 Hemiplegia and hemiparesis following cerebral infarction affecting right dominant side: Secondary | ICD-10-CM | POA: Diagnosis not present

## 2022-10-07 DIAGNOSIS — W19XXXA Unspecified fall, initial encounter: Secondary | ICD-10-CM | POA: Diagnosis not present

## 2022-10-07 DIAGNOSIS — M6281 Muscle weakness (generalized): Secondary | ICD-10-CM | POA: Diagnosis not present

## 2022-10-13 NOTE — Progress Notes (Signed)
[facility] [address] [place]. [place] [address] [phone]       STROKE FOLLOW UP NOTE  Alex Grant Date of Birth:  [birth year] Medical Record Number:  [id number]   Reason for visit: Stroke follow up    SUBJECTIVE:   CHIEF COMPLAINT:  Chief Complaint  Patient presents with   Room 2    Pt is here with his Father and Step-Mother. Pt states that since September he hasn't been able to walk, Pt states that insurance stopped paying for physical therapy so he ahs been unable to walk. Pt states no headaches.      HPI:   Update 10/14/2022 JM: Returns for 61-month stroke follow-up accompanied by his father and stepmother.  He was previously here with his brother but he unfortunately passed away recently.  He continues to reside at [facility].  Unfortunately, insurance stopped paying for therapy back in September and is currently nonambulatory.  He was previously taking some steps during therapy sessions.  Continued right spastic hemiparesis - does note some improvement of proximal arm weakness. Does continue to have increased tone in both arm and leg, denies much improvement on baclofen although tolerating current dose well. He has been having increased lower back pain with sciatica on right side but feels this is due to sitting in w/c for prolonged periods of time.  He and family are understandably frustrated that he has not been able to participate in therapies which he needs as his goal is to eventually return back home.  Prior to his brother passing, he was working on applying for Social Security disability, he was denied due to assets and now stepmother has taken over this process and plans on appealing denial. Denies new stroke/TIA symptoms.  Remains on aspirin and Crestor Blood pressure well-controlled     History provided for reference purposes only Initial visit 06/10/2022 JM: Patient is being seen for initial hospital follow-up accompanied by his  brother.  Continues to reside at [facility].  Reports residual right-sided weakness and speech difficulty.  Currently working with therapies noting improvement of speech and leg weakness, denies much improvement of RUE. Does have some pain in right arm and leg. Wears arm brace at night. Yesterday, able to take 35 steps with use of PT assistance, hemiwalker and AFO brace.  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  Previously living on his own, was never married nor had any children.  Denies any new stroke/TIA symptoms.   Completed 3 weeks DAPT, remains on aspirin alone as well as Crestor, denies side effects Blood pressure today 118/62  No further concerns at this time  Stroke admission 04/17/2022 Alex Grant is a 56 y.o. male with history of hyperlipidemia and hypertension who presented on 04/17/2022 after being found down at home with right sided hemiplegia, left gaze deviation and aphasia as well as hypertensive urgency.  Personally reviewed hospitalization pertinent progress notes, lab work and imaging.  Evaluated by Dr. Roda Shutters for left BG/CR large infarct likely secondary to small vessel disease given uncontrolled risk factors however cardioembolic source cannot be completely ruled out due to size.  Recommended 30-day CardioNet monitor outpatient to rule out A-fib.  CTA head/neck negative LVO.  EF 65 to 70%.  LDL 217.  A1c 5.9.  Noncompliant with aspirin PTA, recommended DAPT for 3 weeks and aspirin alone as  well as initiating Crestor 40 mg daily. BG gradually improved and placed on amlodipine and metoprolol with long-term BP goal normotensive range.  Evidence of rhabdomyolysis, AKI, dehydration, transaminitis,  leukocytosis and hypernatremia, treated with IV fluids and antibiotics with improvement.  Incidental finding of right thyroid nodule on imaging and recommend  outpatient follow-up with PCP.  Residual deficits of dysphagia (Dys 1 HTL diet), dense expressive aphasia and right hemiplegia.  Therapies initially recommended CIR but due to lack of caregiver support recommended discharge to SNF.      PERTINENT IMAGING  Per hospitalization 04/17/2022 -04/27/2022 Code Stroke CT head Acute left basal ganglia infarct ASPECTS 7   CTA head & neck No LVO or hemodynamically significant stenosis CT perfusion infarct not seen, likely due to it being 24 hours old MRI acute perforator infarct in left basal ganglia and corona radiata, chronic small vessel ischemia 2D Echo EF 65-70%   LDL 217 HgbA1c 5.9 UDS neg    ROS:   14 system review of systems performed and negative with exception of those listed in HPI  PMH:  Past Medical History:  Diagnosis Date   High cholesterol    Hypertension     PSH:  Past Surgical History:  Procedure Laterality Date   NO PAST SURGERIES      Social History:  Social History   Socioeconomic History   Marital status: Single    Spouse name: Not on file   Number of children: Not on file   Years of education: Not on file   Highest education level: Not on file  Occupational History   Not on file  Tobacco Use   Smoking status: Never   Smokeless tobacco: Never  Substance and Sexual Activity   Alcohol use: No   Drug use: No   Sexual activity: Not on file  Other Topics Concern   Not on file  Social History Narrative   Not on file   Social Determinants of Health   Financial Resource Strain: Not on file  Food Insecurity: Not on file  Transportation Needs: Not on file  Physical Activity: Not on file  Stress: Not on file  Social Connections: Not on file  Intimate Partner Violence: Not on file    Family History:  Family History  Problem Relation Age of Onset   Diabetes Mellitus II Father    CAD Other     Medications:   Current Outpatient Medications on File Prior to Visit  Medication Sig Dispense Refill    amLODipine (NORVASC) 10 MG tablet Take 1 tablet (10 mg total) by mouth daily. 30 tablet 0   aspirin EC 81 MG tablet Take 81 mg by mouth daily.     Baclofen 5 MG TABS Take 1 tablet at night for 7 days then increase to 5mg  twice daily 60 tablet 5   carvedilol (COREG) 25 MG tablet Take 1 tablet (25 mg total) by mouth 2 (two) times daily with a meal. 60 tablet 0   irbesartan (AVAPRO) 75 MG tablet Take 1 tablet (75 mg total) by mouth daily. 30 tablet 0   melatonin 1 MG TABS tablet Take 3 mg by mouth at bedtime.     omega-3 fish oil (MAXEPA) 1000 MG CAPS capsule Take by mouth.     omeprazole (PRILOSEC) 20 MG capsule Take 20 mg by mouth daily.     polyethylene glycol (MIRALAX / GLYCOLAX) 17 g packet Take 17 g by mouth daily.     rosuvastatin (CRESTOR) 40 MG tablet Take  1 tablet (40 mg total) by mouth daily at 6 PM. 30 tablet 2   sertraline (ZOLOFT) 25 MG tablet Take 25 mg by mouth daily.     No current facility-administered medications on file prior to visit.    Allergies:   Allergies  Allergen Reactions   Amoxil [Amoxicillin] Hives      OBJECTIVE:  Physical Exam  Vitals:   10/14/22 1422  BP: 107/62  Pulse: (!) 52  Height: 5\' 8"  (1.727 m)   Body mass index is 28.53 kg/m. No results found.  General: well developed, well nourished, very pleasant middle-age Caucasian male, seated, in no evident distress Head: head normocephalic and atraumatic.   Neck: supple with no carotid or supraclavicular bruits Cardiovascular: regular rate and rhythm, no murmurs Musculoskeletal: no deformity Skin:  no rash/petichiae Vascular:  Normal pulses all extremities   Neurologic Exam Mental Status: Awake and fully alert.  Mild dysarthria and occasional word finding difficulty.  Oriented to place and time. Recent and remote memory intact. Attention span, concentration and fund of knowledge appropriate. Mood and affect appropriate.  Cranial Nerves: Pupils equal, briskly reactive to light. Extraocular  movements full without nystagmus. Visual fields full to confrontation. Hearing intact. Facial sensation intact.  Right lower facial weakness.  Tongue, palate moves normally and symmetrically.  Motor: Normal strength, bulk and tone left upper and lower extremity RUE: 3/5 proximal, 2/5 distal with increased tone throughout RLE: 3/5 HF, 4+/5 knee extension and flexion with increased tone, 2/5 ADF, 3/5 APF Sensory.: intact to touch , pinprick , position and vibratory sensation.  Coordination: Rapid alternating movements normal on left side. Finger-to-nose and heel-to-shin performed accurately on left side. Gait and Station: Deferred Reflexes: 3+ right upper and lower extremity, 2+ left upper and lower extremity. Toes downgoing.       ASSESSMENT: Alex Grant is a 56 y.o. 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 Vascular risk factors include uncontrolled HTN, uncontrolled HLD, pre-DM, noncompliant with medication and poor diet.      PLAN:  Large left BG/CR stroke:  Residual deficit: Right spastic hemiparesis, gait impairment and speech impairment.  Highly recommend restarting therapies due to continued deficits and now low back pain.  Increase baclofen to 10 mg twice daily.  Consider evaluation for Botox in the future if needed.  Encouraged pursuing Social Security disability at this time. 30-day cardiac monitor negative for A-fib Hypercoagulable labs negative except factor V Leiden.  No history of DVT/PE.  Continue aspirin 81 mg daily  and Crestor 40 mg daily for secondary stroke prevention.  Discussed importance of medication compliance and use of these medications lifelong unless contraindicated in the future Discussed secondary stroke prevention measures and importance of close PCP follow up for aggressive stroke risk factor management  including BP goal<130/90, HLD with LDL goal<70 and pre-DM with A1c.<7  Stroke labs 05/2022: LDL 43, A1c 5.7  I have gone over the pathophysiology of stroke, warning signs and symptoms, risk factors and their management in some detail with instructions to go to the closest emergency room for symptoms of concern.     Follow up in 3-4 months or call earlier if needed     I spent 38 minutes of face-to-face and non-face-to-face time with patient and family.  This included previsit chart review, lab review, study review, order entry, electronic health record documentation, patient and family  education and discussion regarding above diagnoses and treatment plan and answered all the questions to patient and family's satisfaction  Ihor Austin, [facility]  [facility] [address] [place], [place] [address]  Phone [phone] Fax [phone] Note: This document was prepared with digital dictation and possible smart phrase technology. Any transcriptional errors that result from this process are unintentional.

## 2022-10-14 ENCOUNTER — Ambulatory Visit (INDEPENDENT_AMBULATORY_CARE_PROVIDER_SITE_OTHER): Payer: BC Managed Care – PPO | Admitting: Adult Health

## 2022-10-14 ENCOUNTER — Telehealth: Payer: Self-pay | Admitting: Adult Health

## 2022-10-14 ENCOUNTER — Encounter: Payer: Self-pay | Admitting: Adult Health

## 2022-10-14 VITALS — BP 107/62 | HR 52 | Ht 68.0 in

## 2022-10-14 DIAGNOSIS — M6282 Rhabdomyolysis: Secondary | ICD-10-CM

## 2022-10-14 DIAGNOSIS — M5441 Lumbago with sciatica, right side: Secondary | ICD-10-CM

## 2022-10-14 DIAGNOSIS — I6381 Other cerebral infarction due to occlusion or stenosis of small artery: Secondary | ICD-10-CM

## 2022-10-14 DIAGNOSIS — R7303 Prediabetes: Secondary | ICD-10-CM

## 2022-10-14 DIAGNOSIS — G8111 Spastic hemiplegia affecting right dominant side: Secondary | ICD-10-CM

## 2022-10-14 DIAGNOSIS — G8929 Other chronic pain: Secondary | ICD-10-CM

## 2022-10-14 DIAGNOSIS — Z09 Encounter for follow-up examination after completed treatment for conditions other than malignant neoplasm: Secondary | ICD-10-CM

## 2022-10-14 MED ORDER — BACLOFEN 10 MG PO TABS: 10.0000 mg | ORAL_TABLET | Freq: Three times a day (TID) | ORAL | 5 refills | Status: DC

## 2022-10-14 NOTE — Patient Instructions (Signed)
Your Plan:  Increase baclofen to 10 mg twice daily for continued spasticity PLEASE monitor for need of dosage increase if spasticity/tightness persists If no benefit with baclofen or unable to tolerate higher dosage, can consider referral for botox treatments  Recommend restart of therapy due to continued right spastic hemiparesis and gait impairment - orders will be placed  Continue aspirin and Crestor for secondary stroke prevention measures    Follow-up in 3 to 4 months or call earlier if needed     Thank you for coming to see [place] at [facility]. I hope we have been able to provide you high quality care today.  You may receive a patient satisfaction survey over the next few weeks. We would appreciate your feedback and comments so that we may continue to improve ourselves and the health of our patients.

## 2022-10-14 NOTE — Addendum Note (Signed)
Addended by: Judi Cong on: 10/14/2022 05:25 PM   Modules accepted: Orders

## 2022-10-14 NOTE — Telephone Encounter (Signed)
Order placed for the patient for referral to PT/OT at [facility].

## 2022-10-14 NOTE — Telephone Encounter (Signed)
Clifton Custard with [facility] said that this needs to be a PT/OT referral and sent to [facility] since they do therapy on site. Outside home health is not allowed in.

## 2022-10-19 NOTE — Telephone Encounter (Signed)
Spoke with [facility]. They do not have a bed for patients that have BCBS; because they are not paying them right now.

## 2022-10-19 NOTE — Addendum Note (Signed)
Addended by: Ihor Austin L on: 10/19/2022 03:45 PM   Modules accepted: Orders

## 2022-10-19 NOTE — Telephone Encounter (Signed)
Spoke with [facility]; referral for PT needs to be sent from the physician's office. She said can send through [facility] or Fax. Phone: [phone], [phone]

## 2022-10-19 NOTE — Telephone Encounter (Signed)
Patient is already a resident at that facility?

## 2022-10-19 NOTE — Telephone Encounter (Signed)
Can we contact [facility] to see if orders for therapy need to be through home health or outpatient orders? Was initially placed for home health but was sent to [facility] who said these need to be sent to [facility], was sent to [facility] but went to their outpatient clinic. Unsure if we can just request therapies to be started and they can process this from their end. Thank you!

## 2022-10-19 NOTE — Telephone Encounter (Signed)
Ambulatory referral placed for physical and Occupational Therapy requested to be sent to [facility] skilled nursing facility (SNF) (please do not send to outpatient [facility] rehab). Thank you.

## 2022-10-19 NOTE — Telephone Encounter (Signed)
The PT/OT is suppose to be completed thru [facility] facility. Please see if the orders that are already entered in will be able to be used for this. If another referral needs to be entered in, let me know.

## 2022-10-20 NOTE — Telephone Encounter (Signed)
Can you please advise father/step mother regarding this (they do not use his MyChart). Thank you.

## 2022-11-01 NOTE — Telephone Encounter (Signed)
Orders placed again for work with PT/OT at his current facility [facility].  PT/OT was denied previously possibly as this was within the same calendar year of completing prior therapies. Hopefully they will approve additional therapies with new year. Thank you.

## 2022-11-01 NOTE — Addendum Note (Signed)
Addended by: Frann Rider L on: 11/01/2022 03:32 PM   Modules accepted: Orders

## 2022-11-01 NOTE — Telephone Encounter (Signed)
Referral for OT and PT sent to [facility]. Phone: [phone], Fax: [phone]

## 2023-01-12 ENCOUNTER — Telehealth: Payer: Self-pay | Admitting: Adult Health

## 2023-01-12 NOTE — Telephone Encounter (Signed)
LVM and sent mychart msg informing pt of r/s needed for 3/27 appt- NP out.

## 2023-01-14 ENCOUNTER — Telehealth: Payer: Self-pay | Admitting: Adult Health

## 2023-01-14 NOTE — Telephone Encounter (Signed)
Pt step mother called. Stated pt needed a appointment with Janett Billow. I informed her Janett Billow next appointment was 7/16. She asked if pt can see another provider for this appointment. She is requesting a call back

## 2023-01-19 ENCOUNTER — Ambulatory Visit: Payer: Self-pay | Admitting: Adult Health

## 2023-03-28 ENCOUNTER — Ambulatory Visit: Payer: Self-pay

## 2023-03-28 ENCOUNTER — Other Ambulatory Visit (HOSPITAL_COMMUNITY): Payer: Self-pay

## 2023-03-28 VITALS — BP 128/57 | HR 64 | Temp 98.1°F | Ht 67.0 in | Wt 157.0 lb

## 2023-03-28 DIAGNOSIS — G8111 Spastic hemiplegia affecting right dominant side: Secondary | ICD-10-CM

## 2023-03-28 DIAGNOSIS — M5430 Sciatica, unspecified side: Secondary | ICD-10-CM | POA: Insufficient documentation

## 2023-03-28 DIAGNOSIS — E041 Nontoxic single thyroid nodule: Secondary | ICD-10-CM

## 2023-03-28 DIAGNOSIS — K5904 Chronic idiopathic constipation: Secondary | ICD-10-CM

## 2023-03-28 DIAGNOSIS — M5431 Sciatica, right side: Secondary | ICD-10-CM

## 2023-03-28 DIAGNOSIS — M5441 Lumbago with sciatica, right side: Secondary | ICD-10-CM

## 2023-03-28 DIAGNOSIS — E782 Mixed hyperlipidemia: Secondary | ICD-10-CM

## 2023-03-28 DIAGNOSIS — R7303 Prediabetes: Secondary | ICD-10-CM

## 2023-03-28 DIAGNOSIS — I639 Cerebral infarction, unspecified: Secondary | ICD-10-CM

## 2023-03-28 DIAGNOSIS — D751 Secondary polycythemia: Secondary | ICD-10-CM

## 2023-03-28 DIAGNOSIS — G8929 Other chronic pain: Secondary | ICD-10-CM

## 2023-03-28 DIAGNOSIS — I10 Essential (primary) hypertension: Secondary | ICD-10-CM

## 2023-03-28 LAB — POCT GLYCOSYLATED HEMOGLOBIN (HGB A1C): Hemoglobin A1C: 5.7 % — AB (ref 4.0–5.6)

## 2023-03-28 LAB — GLUCOSE, CAPILLARY: Glucose-Capillary: 97 mg/dL (ref 70–99)

## 2023-03-28 MED ORDER — IRBESARTAN 75 MG PO TABS
75.0000 mg | ORAL_TABLET | Freq: Every day | ORAL | 2 refills | Status: AC
Start: 2023-03-28 — End: 2024-01-09
  Filled 2023-03-28: qty 30, 30d supply, fill #0

## 2023-03-28 MED ORDER — AMLODIPINE BESYLATE 10 MG PO TABS
10.0000 mg | ORAL_TABLET | Freq: Every day | ORAL | 2 refills | Status: AC
  Filled 2023-03-28: qty 30, 30d supply, fill #0

## 2023-03-28 MED ORDER — IBUPROFEN 800 MG PO TABS
800.0000 mg | ORAL_TABLET | Freq: Three times a day (TID) | ORAL | 0 refills | Status: AC | PRN
  Filled 2023-03-28: qty 21, 7d supply, fill #0

## 2023-03-28 MED ORDER — CARVEDILOL 25 MG PO TABS
25.0000 mg | ORAL_TABLET | Freq: Two times a day (BID) | ORAL | 2 refills | Status: AC
  Filled 2023-03-28: qty 60, 30d supply, fill #0

## 2023-03-28 MED ORDER — ASPIRIN 81 MG PO TBEC
81.0000 mg | DELAYED_RELEASE_TABLET | Freq: Every day | ORAL | 2 refills | Status: AC
  Filled 2023-03-28: qty 30, 30d supply, fill #0

## 2023-03-28 MED ORDER — BISACODYL 5 MG PO TBEC
5.0000 mg | DELAYED_RELEASE_TABLET | Freq: Two times a day (BID) | ORAL | 2 refills | Status: AC | PRN
Start: 2023-03-28 — End: ?
  Filled 2023-03-28: qty 30, 15d supply, fill #0

## 2023-03-28 MED ORDER — ROSUVASTATIN CALCIUM 40 MG PO TABS
40.0000 mg | ORAL_TABLET | Freq: Every day | ORAL | 2 refills | Status: AC
Start: 2023-03-28 — End: ?
  Filled 2023-03-28: qty 30, 30d supply, fill #0
  Filled 2023-04-20: qty 30, 30d supply, fill #1

## 2023-03-28 MED ORDER — BACLOFEN 10 MG PO TABS
10.0000 mg | ORAL_TABLET | Freq: Two times a day (BID) | ORAL | 2 refills | Status: DC
Start: 2023-03-28 — End: 2023-06-08
  Filled 2023-03-28: qty 60, 30d supply, fill #0

## 2023-03-28 NOTE — Patient Instructions (Addendum)
Alex Grant, it was a pleasure seeing you today!  Today we discussed: Sciatica - Take ibuprofen 800 mg every 8 hours during the day for one week.  Will print off back stretches. Will get labs and order refills through $4 plan.  I have ordered the following labs today:  Lab Orders         CMP14 + Anion Gap         CBC no Diff         Lipid Profile         POC Hbg A1C       Tests ordered today:  none  Referrals ordered today:   Referral Orders  No referral(s) requested today     I have ordered the following medication/changed the following medications:   Stop the following medications: Medications Discontinued During This Encounter  Medication Reason   cephALEXin (KEFLEX) 500 MG capsule Completed Course   hydrOXYzine (ATARAX) 25 MG tablet No longer needed (for PRN medications)   meclizine (ANTIVERT) 25 MG tablet No longer needed (for PRN medications)   melatonin 1 MG TABS tablet No longer needed (for PRN medications)   aspirin EC 81 MG tablet    omega-3 fish oil (MAXEPA) 1000 MG CAPS capsule No longer needed (for PRN medications)   sertraline (ZOLOFT) 25 MG tablet No longer needed (for PRN medications)   amLODipine (NORVASC) 10 MG tablet Reorder   irbesartan (AVAPRO) 75 MG tablet Reorder   carvedilol (COREG) 25 MG tablet Reorder   rosuvastatin (CRESTOR) 40 MG tablet Reorder   bisacodyl (DULCOLAX) 5 MG EC tablet Reorder   baclofen (LIORESAL) 10 MG tablet Reorder     Start the following medications: Meds ordered this encounter  Medications   ibuprofen (ADVIL) 800 MG tablet    Sig: Take 1 tablet (800 mg total) by mouth every 8 (eight) hours as needed for up to 7 days.    Dispense:  21 tablet    Refill:  0    IM program   amLODipine (NORVASC) 10 MG tablet    Sig: Take 1 tablet (10 mg total) by mouth daily.    Dispense:  30 tablet    Refill:  2    IM program   aspirin EC 81 MG tablet    Sig: Take 1 tablet (81 mg total) by mouth daily. Swallow whole.     Dispense:  30 tablet    Refill:  2    IM program   carvedilol (COREG) 25 MG tablet    Sig: Take 1 tablet (25 mg total) by mouth 2 (two) times daily with a meal.    Dispense:  60 tablet    Refill:  2    IM program   irbesartan (AVAPRO) 75 MG tablet    Sig: Take 1 tablet (75 mg total) by mouth daily.    Dispense:  30 tablet    Refill:  2    IM program   bisacodyl (DULCOLAX) 5 MG EC tablet    Sig: Take 1 tablet (5 mg total) by mouth 2 (two) times daily as needed for moderate constipation.    Dispense:  30 tablet    Refill:  2    IM program   rosuvastatin (CRESTOR) 40 MG tablet    Sig: Take 1 tablet (40 mg total) by mouth daily at 6 PM.    Dispense:  30 tablet    Refill:  2    IM program   baclofen (LIORESAL) 10 MG  tablet    Sig: Take 1 tablet (10 mg total) by mouth 2 (two) times daily.    Dispense:  60 tablet    Refill:  2    IM program     Follow-up: 6 months   Please make sure to arrive 15 minutes prior to your next appointment. If you arrive late, you may be asked to reschedule.   We look forward to seeing you next time. Please call our clinic at [phone] if you have any questions or concerns. The best time to call is Monday-Friday from 9am-4pm, but there is someone available 24/7. If after hours or the weekend, call the main hospital number and ask for the Internal Medicine Resident On-Call. If you need medication refills, please notify your pharmacy one week in advance and they will send [place] a request.  Thank you for letting us take part in your care. Wishing you the best!  Thank you, Adron Bene, MD

## 2023-03-28 NOTE — Progress Notes (Signed)
CC: Establish care  HPI:  [name] is a 57 y.o. with past medical history as below who presents to establish care.  Please see detailed assessment and plan for HPI.  Lives alone. On disability. Used to work at [employer]. Denies all substances.  Can perform all ADLs except tie shoes. Uses walker. Performs all iADLs.   Mother has dementia. Pernicious anemia.  Father has diabetes. Kidney disease. HTN. HLD.  Brother died of MI at 57.    Past Medical History:  Diagnosis Date   High cholesterol    Hypertension    Review of Systems: Please see detailed assessment plan for pertinent ROS.  Physical Exam:  Vitals:   03/28/23 0840  BP: (!) 128/57  Pulse: 64  Temp: 98.1 F (36.7 C)  TempSrc: Oral  SpO2: 98%  Weight: 157 lb (71.2 kg)  Height: 5\' 7"  (1.702 m)   Physical Exam HENT:     Head: Normocephalic and atraumatic.  Eyes:     Extraocular Movements: Extraocular movements intact.  Cardiovascular:     Rate and Rhythm: Normal rate and regular rhythm.     Comments: Systolic murmur Pulmonary:     Effort: Pulmonary effort is normal.     Breath sounds: No wheezing or rales.  Abdominal:     General: There is no distension.     Palpations: Abdomen is soft.     Tenderness: There is no abdominal tenderness.  Musculoskeletal:     Cervical back: Neck supple.     Right lower leg: No edema.     Left lower leg: No edema.     Comments: Positive straight leg raise.  Skin:    General: Skin is warm and dry.  Neurological:     Mental Status: He is alert and oriented to person, place, and time.     Comments: EOMI. Right facial droop, 1/5 strength at RUE, 1/5 strength at RLE. Sensation intact throughout. Lid lag on right.  Psychiatric:        Mood and Affect: Mood normal.        Behavior: Behavior normal.      Assessment & Plan:   See Encounters Tab for problem based charting.  Hypertension Patient presents with a history of hypertension.  Blood pressure 128/57  today.  Denies headaches, chest pain, shortness of breath. -Continue amlodipine 10 mg daily -Continue carvedilol 25 mg twice daily -Continue irbesartan 75 mg daily -Follow-up renal function, CMP for electrolytes  Right thyroid nodule 2.2 cm right thyroid nodule found incidentally on CT neck 04/17/2022. -Needs follow-up ultrasound  Acute ischemic stroke [facility]) Patient presents with history of stroke found 04/18/2022 with brain MRI demonstrating acute perforator infarct at the left basal ganglia and corona radiata likely secondary to small vessel disease.  He has persistent right-sided weakness as well as right facial droop and dysarthria.  He is able to get around using a walker.  Endorses independent ADLs and IADLs.  Had a long SNF stay at [facility] but has been living home alone for 7 days now. -Continue aspirin 81 mg daily -Continue Crestor 40 mg daily -Follow-up with neurology as scheduled  Prediabetes Repeat A1c.  Last A1c 5.7.  HLD (hyperlipidemia) Chronic, stable. -Repeat lipid panel -Continue Crestor 40 mg daily  Sciatica Patient presents with sciatica on the right side.  Sharp shooting pain radiates from buttock to toes.  Certain positional changes modify the pain.  During the visit, patient repeatedly asked to stand up to relieve the pain.  He has not tried any medications for the pain but does state that physical therapy has helped in the past.  Unfortunately, he does not have insurance so I will print out some back stretches for sciatica and lieu of physical therapy referral today.  Positive straight leg raise.  Denies bowel or bladder incontinence or weakness beyond baseline post stroke changes. -Trial ibuprofen for 7 days -Back stretches.  PT referral once he has financial assistance. -Baclofen refilled  Patient discussed with Dr. Antony Contras

## 2023-03-28 NOTE — Assessment & Plan Note (Signed)
Patient presents with history of stroke found 04/18/2022 with brain MRI demonstrating acute perforator infarct at the left basal ganglia and corona radiata likely secondary to small vessel disease.  He has persistent right-sided weakness as well as right facial droop and dysarthria.  He is able to get around using a walker.  Endorses independent ADLs and IADLs.  Had a long SNF stay at [facility] but has been living home alone for 7 days now. -Continue aspirin 81 mg daily -Continue Crestor 40 mg daily -Follow-up with neurology as scheduled

## 2023-03-28 NOTE — Assessment & Plan Note (Signed)
Patient presents with sciatica on the right side.  Sharp shooting pain radiates from buttock to toes.  Certain positional changes modify the pain.  During the visit, patient repeatedly asked to stand up to relieve the pain.  He has not tried any medications for the pain but does state that physical therapy has helped in the past.  Unfortunately, he does not have insurance so I will print out some back stretches for sciatica and lieu of physical therapy referral today.  Positive straight leg raise.  Denies bowel or bladder incontinence or weakness beyond baseline post stroke changes. -Trial ibuprofen for 7 days -Back stretches.  PT referral once he has financial assistance. -Baclofen refilled

## 2023-03-28 NOTE — Assessment & Plan Note (Signed)
2.2 cm right thyroid nodule found incidentally on CT neck 04/17/2022. -Needs follow-up ultrasound

## 2023-03-28 NOTE — Assessment & Plan Note (Signed)
Repeat A1c.  Last A1c 5.7.

## 2023-03-28 NOTE — Assessment & Plan Note (Signed)
Patient presents with a history of hypertension.  Blood pressure 128/57 today.  Denies headaches, chest pain, shortness of breath. -Continue amlodipine 10 mg daily -Continue carvedilol 25 mg twice daily -Continue irbesartan 75 mg daily -Follow-up renal function, CMP for electrolytes

## 2023-03-28 NOTE — Assessment & Plan Note (Signed)
Chronic, stable. -Repeat lipid panel -Continue Crestor 40 mg daily

## 2023-03-28 NOTE — Progress Notes (Signed)
Internal Medicine Clinic Attending  Case discussed with Dr. White  At the time of the visit.  We reviewed the resident's history and exam and pertinent patient test results.  I agree with the assessment, diagnosis, and plan of care documented in the resident's note.  

## 2023-03-29 LAB — LIPID PANEL
Chol/HDL Ratio: 3.2 ratio (ref 0.0–5.0)
Cholesterol, Total: 129 mg/dL (ref 100–199)
HDL: 40 mg/dL (ref >39)
LDL Chol Calc (NIH): 50 mg/dL (ref 0–99)
Triglycerides: 248 mg/dL — ABNORMAL HIGH (ref 0–149)
VLDL Cholesterol Cal: 39 mg/dL (ref 5–40)

## 2023-03-29 LAB — CMP14 + ANION GAP
ALT: 18 IU/L (ref 0–44)
AST: 16 IU/L (ref 0–40)
Albumin/Globulin Ratio: 2 (ref 1.2–2.2)
Albumin: 4.6 g/dL (ref 3.8–4.9)
Alkaline Phosphatase: 83 IU/L (ref 44–121)
Anion Gap: 13 mmol/L (ref 10.0–18.0)
BUN/Creatinine Ratio: 14 (ref 9–20)
BUN: 11 mg/dL (ref 6–24)
Bilirubin Total: 0.3 mg/dL (ref 0.0–1.2)
CO2: 24 mmol/L (ref 20–29)
Calcium: 9.6 mg/dL (ref 8.7–10.2)
Chloride: 104 mmol/L (ref 96–106)
Creatinine, Ser: 0.78 mg/dL (ref 0.76–1.27)
Globulin, Total: 2.3 g/dL (ref 1.5–4.5)
Glucose: 94 mg/dL (ref 70–99)
Potassium: 3.7 mmol/L (ref 3.5–5.2)
Sodium: 141 mmol/L (ref 134–144)
Total Protein: 6.9 g/dL (ref 6.0–8.5)
eGFR: 103 mL/min/{1.73_m2} (ref >59)

## 2023-03-29 LAB — CBC
Hematocrit: 38.2 % (ref 37.5–51.0)
Hemoglobin: 13.2 g/dL (ref 13.0–17.7)
MCH: 30.3 pg (ref 26.6–33.0)
MCHC: 34.6 g/dL (ref 31.5–35.7)
MCV: 88 fL (ref 79–97)
Platelets: 256 10*3/uL (ref 150–450)
RBC: 4.35 x10E6/uL (ref 4.14–5.80)
RDW: 12.3 % (ref 11.6–15.4)
WBC: 6.8 10*3/uL (ref 3.4–10.8)

## 2023-04-20 ENCOUNTER — Other Ambulatory Visit (HOSPITAL_COMMUNITY): Payer: Self-pay

## 2023-04-21 ENCOUNTER — Other Ambulatory Visit (HOSPITAL_COMMUNITY): Payer: Self-pay

## 2023-04-29 ENCOUNTER — Other Ambulatory Visit: Payer: Self-pay | Admitting: Family Medicine

## 2023-04-29 DIAGNOSIS — E041 Nontoxic single thyroid nodule: Secondary | ICD-10-CM

## 2023-05-13 ENCOUNTER — Ambulatory Visit
Admission: RE | Admit: 2023-05-13 | Discharge: 2023-05-13 | Disposition: A | Discharge status: Home / Self Care | Payer: Medicaid Other | Source: Ambulatory Visit | Attending: Family Medicine | Admitting: Family Medicine

## 2023-05-13 DIAGNOSIS — E041 Nontoxic single thyroid nodule: Secondary | ICD-10-CM

## 2023-06-07 ENCOUNTER — Other Ambulatory Visit (HOSPITAL_COMMUNITY): Payer: Self-pay

## 2023-06-07 ENCOUNTER — Ambulatory Visit: Payer: Medicaid Other

## 2023-06-07 ENCOUNTER — Ambulatory Visit (INDEPENDENT_AMBULATORY_CARE_PROVIDER_SITE_OTHER): Payer: Medicaid Other | Admitting: Podiatry

## 2023-06-07 ENCOUNTER — Ambulatory Visit (INDEPENDENT_AMBULATORY_CARE_PROVIDER_SITE_OTHER): Payer: Medicaid Other

## 2023-06-07 DIAGNOSIS — L03039 Cellulitis of unspecified toe: Secondary | ICD-10-CM | POA: Diagnosis not present

## 2023-06-07 DIAGNOSIS — M2021 Hallux rigidus, right foot: Secondary | ICD-10-CM | POA: Diagnosis not present

## 2023-06-07 MED ORDER — AZITHROMYCIN 250 MG PO TABS
ORAL_TABLET | ORAL | 0 refills | Status: DC
  Filled 2023-06-07: qty 6, 5d supply, fill #0

## 2023-06-07 NOTE — Progress Notes (Unsigned)
[facility] [address] [place] [place] [address] [phone]       STROKE FOLLOW UP NOTE  [name] Date of Birth:  Apr 07, 1966 Medical Record Number:  [id number]   Reason for visit: Stroke follow up    SUBJECTIVE:   CHIEF COMPLAINT:  No chief complaint on file.   HPI:   Update 06/08/2023 JM: Patient returns for follow-up visit.  Previously residing at [facility] but returned home end of May ***.  Continued right spastic hemiparesis ***.  Ambulates ***.   Denies new stroke/TIA symptoms  Compliant on aspirin and Crestor.  Routinely follows with PCP for stroke risk factor management     History provided for reference purposes only Update 10/14/2022 JM: Returns for 71-month stroke follow-up accompanied by his father and stepmother.  He was previously here with his brother but he unfortunately passed away recently.  He continues to reside at [facility].  Unfortunately, insurance stopped paying for therapy back in September and is currently nonambulatory.  He was previously taking some steps during therapy sessions.  Continued right spastic hemiparesis - does note some improvement of proximal arm weakness. Does continue to have increased tone in both arm and leg, denies much improvement on baclofen although tolerating current dose well. He has been having increased lower back pain with sciatica on right side but feels this is due to sitting in w/c for prolonged periods of time.  He and family are understandably frustrated that he has not been able to participate in therapies which he needs as his goal is to eventually return back home.  Prior to his brother passing, he was working on applying for Social Security disability, he was denied due to assets and now stepmother has taken over this process and plans on appealing denial. Denies new stroke/TIA symptoms.  Remains on aspirin and Crestor Blood pressure well-controlled  Initial visit 06/10/2022  JM: Patient is being seen for initial hospital follow-up accompanied by his brother.  Continues to reside at [facility].  Reports residual right-sided weakness and speech difficulty.  Currently working with therapies noting improvement of speech and leg weakness, denies much improvement of RUE. Does have some pain in right arm and leg. Wears arm brace at night. Yesterday, able to take 35 steps with use of PT assistance, hemiwalker and AFO brace.  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  Previously living on his own, was never married nor had any children.  Denies any new stroke/TIA symptoms.   Completed 3 weeks DAPT, remains on aspirin alone as well as Crestor, denies side effects Blood pressure today 118/62  No further concerns at this time  Stroke admission 04/17/2022 [name] is a 57 y.o. male with history of hyperlipidemia and hypertension who presented on 04/17/2022 after being found down at home with right sided hemiplegia, left gaze deviation and aphasia as well as hypertensive urgency.  Personally reviewed hospitalization pertinent progress notes, lab work and imaging.  Evaluated by Dr. Roda Shutters for left BG/CR large infarct likely secondary to small vessel disease given uncontrolled risk factors however cardioembolic source cannot be completely ruled out due to size.  Recommended 30-day CardioNet monitor outpatient to rule out A-fib.  CTA head/neck negative LVO.  EF 65 to 70%.  LDL 217.  A1c 5.9.  Noncompliant with aspirin PTA, recommended DAPT for 3 weeks  and aspirin alone as well as initiating Crestor 40 mg daily. BG gradually improved and placed on amlodipine and metoprolol with long-term BP goal normotensive range.  Evidence of rhabdomyolysis, AKI, dehydration, transaminitis,  leukocytosis and hypernatremia, treated with IV fluids and antibiotics with  improvement.  Incidental finding of right thyroid nodule on imaging and recommend outpatient follow-up with PCP.  Residual deficits of dysphagia (Dys 1 HTL diet), dense expressive aphasia and right hemiplegia.  Therapies initially recommended CIR but due to lack of caregiver support recommended discharge to SNF.      PERTINENT IMAGING  Per hospitalization 04/17/2022 -04/27/2022 Code Stroke CT head Acute left basal ganglia infarct ASPECTS 7   CTA head & neck No LVO or hemodynamically significant stenosis CT perfusion infarct not seen, likely due to it being 24 hours old MRI acute perforator infarct in left basal ganglia and corona radiata, chronic small vessel ischemia 2D Echo EF 65-70%   LDL 217 HgbA1c 5.9 UDS neg    ROS:   14 system review of systems performed and negative with exception of those listed in HPI  PMH:  Past Medical History:  Diagnosis Date   High cholesterol    Hypertension     PSH:  Past Surgical History:  Procedure Laterality Date   NO PAST SURGERIES      Social History:  Social History   Socioeconomic History   Marital status: Single    Spouse name: Not on file   Number of children: Not on file   Years of education: Not on file   Highest education level: Not on file  Occupational History   Not on file  Tobacco Use   Smoking status: Never   Smokeless tobacco: Never  Substance and Sexual Activity   Alcohol use: No   Drug use: No   Sexual activity: Not on file  Other Topics Concern   Not on file  Social History Narrative   Not on file   Social Determinants of Health   Financial Resource Strain: Not on file  Food Insecurity: Not on file  Transportation Needs: Not on file  Physical Activity: Not on file  Stress: Not on file  Social Connections: Not on file  Intimate Partner Violence: Not on file    Family History:  Family History  Problem Relation Age of Onset   Diabetes Mellitus II Father    CAD Other     Medications:    Current Outpatient Medications on File Prior to Visit  Medication Sig Dispense Refill   amLODipine (NORVASC) 10 MG tablet Take 1 tablet (10 mg total) by mouth daily. 30 tablet 2   aspirin EC 81 MG tablet Take 1 tablet (81 mg total) by mouth daily. Swallow whole. 30 tablet 2   azithromycin (ZITHROMAX) 250 MG tablet Take 2 tablets on day 1, then 1 tablet daily on days 2 through 5 6 tablet 0   baclofen (LIORESAL) 10 MG tablet Take 1 tablet (10 mg total) by mouth 2 (two) times daily. 60 tablet 2   bisacodyl (DULCOLAX) 5 MG EC tablet Take 1 tablet (5 mg total) by mouth 2 (two) times daily as needed for moderate constipation. 30 tablet 2   carvedilol (COREG) 25 MG tablet Take 1 tablet (25 mg total) by mouth 2 (two) times daily with a meal. 60 tablet 2   irbesartan (AVAPRO) 75 MG tablet Take 1 tablet (75 mg total) by mouth daily. 30 tablet 2   omeprazole (PRILOSEC) 20 MG capsule Take 20 mg  by mouth daily.     polyethylene glycol (MIRALAX / GLYCOLAX) 17 g packet Take 17 g by mouth daily.     rosuvastatin (CRESTOR) 40 MG tablet Take 1 tablet (40 mg total) by mouth daily at 6 PM. 30 tablet 2   No current facility-administered medications on file prior to visit.    Allergies:   Allergies  Allergen Reactions   Amoxil [Amoxicillin] Hives      OBJECTIVE:  Physical Exam  There were no vitals filed for this visit.  There is no height or weight on file to calculate BMI. No results found.  General: well developed, well nourished, very pleasant middle-age Caucasian male, seated, in no evident distress Head: head normocephalic and atraumatic.   Neck: supple with no carotid or supraclavicular bruits Cardiovascular: regular rate and rhythm, no murmurs Musculoskeletal: no deformity Skin:  no rash/petichiae Vascular:  Normal pulses all extremities   Neurologic Exam Mental Status: Awake and fully alert.  Mild dysarthria and occasional word finding difficulty.  Oriented to place and time. Recent  and remote memory intact. Attention span, concentration and fund of knowledge appropriate. Mood and affect appropriate.  Cranial Nerves: Pupils equal, briskly reactive to light. Extraocular movements full without nystagmus. Visual fields full to confrontation. Hearing intact. Facial sensation intact.  Right lower facial weakness.  Tongue, palate moves normally and symmetrically.  Motor: Normal strength, bulk and tone left upper and lower extremity RUE: 3/5 proximal, 2/5 distal with increased tone throughout RLE: 3/5 HF, 4+/5 knee extension and flexion with increased tone, 2/5 ADF, 3/5 APF Sensory.: intact to touch , pinprick , position and vibratory sensation.  Coordination: Rapid alternating movements normal on left side. Finger-to-nose and heel-to-shin performed accurately on left side. Gait and Station: Deferred Reflexes: 3+ right upper and lower extremity, 2+ left upper and lower extremity. Toes downgoing.       ASSESSMENT: Alex Grant is a 57 y.o. 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 Vascular risk factors include uncontrolled HTN, uncontrolled HLD, pre-DM, noncompliant with medication and poor diet.      PLAN:  Large left BG/CR stroke:  Residual deficit: Right spastic hemiparesis, gait impairment and speech impairment.  Highly recommend restarting therapies due to continued deficits and now low back pain.  Increase baclofen to 10 mg twice daily.  Consider evaluation for Botox in the future if needed.  Encouraged pursuing Social Security disability at this time. 30-day cardiac monitor negative for A-fib Hypercoagulable labs negative except factor V Leiden.  No history of DVT/PE.  Continue aspirin 81 mg daily  and Crestor 40 mg daily for secondary stroke prevention.  Discussed importance of medication compliance and use of these  medications lifelong unless contraindicated in the future Discussed secondary stroke prevention measures and importance of close PCP follow up for aggressive stroke risk factor management including BP goal<130/90, HLD with LDL goal<70 and pre-DM with A1c.<7  Stroke labs 05/2022: LDL 43, A1c 5.7  I have gone over the pathophysiology of stroke, warning signs and symptoms, risk factors and their management in some detail with instructions to go to the closest emergency room for symptoms of concern.     Follow up in 3-4 months or call earlier if needed     I spent 38 minutes of face-to-face and non-face-to-face time with patient and family.  This included previsit chart review, lab review, study  review, order entry, electronic health record documentation, patient and family education and discussion regarding above diagnoses and treatment plan and answered all the questions to patient and family's satisfaction  Ihor Austin, [facility]  [facility] [address] [place], [place] [address]  Phone [phone] Fax [phone] Note: This document was prepared with digital dictation and possible smart phrase technology. Any transcriptional errors that result from this process are unintentional.

## 2023-06-07 NOTE — Progress Notes (Signed)
Chief Complaint  Patient presents with   Toe Pain    RIGHT HALLUX PAIN, GOING ON FOR A COUPLE YEARS, GOT A ORTHOTIC AND THAT HELPED, PAIN CAME BACK 3-4 MONTHS ORTHOTIC MAY NEED TO BE REPLACED.   HPI: 57 y.o. male presents today with concern of a painful right great toe joint.  He states it is only painful when he tries to walk.  He is in a wheelchair today but does have a walker with him.  He states that he is due for a new pair of orthotics.  He also is requesting an x-ray of the right foot to evaluate the arthritis.  He states that one of his other specialist did recommend that he get a new x-ray to compare to his old ones.  He does not have his previous x-rays available for review today.  Patient also has pain and redness to the right hallux lateral nail border.  He states that this has been present since someone trimmed his nail.  This was not anyone at this office as this is his  first appointment here, so I am unsure whether this was on a nail salon.  He notes that he is not here for this issue although it does look infected.  Past Medical History:  Diagnosis Date   High cholesterol    Hypertension    Past Surgical History:  Procedure Laterality Date   NO PAST SURGERIES     Allergies  Allergen Reactions   Amoxil [Amoxicillin] Hives   Physical Exam: General: The patient is alert and oriented x3 in no acute distress.  Dermatology: Skin is warm, dry and supple bilateral lower extremities. Interspaces are clear of maceration and debris.  The hallux nail on the right foot, lateral border has localized erythema edema and pain on palpation.  There is a small amount of purulence along the distal aspect of the nail groove.  Vascular: Palpable pedal pulses bilaterally. Capillary refill within normal limits.    Neurological: Light touch sensation grossly intact bilateral feet.   Musculoskeletal Exam: Decreased range of motion at the first metatarsophalangeal joint right foot.  There is  bony palpable prominence on the dorsal aspect of the joint.  No crepitus with attempted range of motion.  Minimal pain on palpation with attempted motion.  No erythema or calor noted.  Normal range of motion at the hallux IP joint on the right.  Radiographic Exam (right foot, 3 nonweightbearing views, 06/07/2023):  Normal osseous mineralization.  There is moderate to significant joint space narrowing at the first metatarsal phalangeal joint of the right foot.  There is some squaring of the metatarsal head noted.  No fracture is seen.  Do not have his previous x-rays available to review.  Patient was requesting that they be compared to previous x-rays but this could not be performed today.  Assessment/Plan of Care: 1. Hallux rigidus, right foot   2. Paronychia of great toe      Meds ordered this encounter  Medications   azithromycin (ZITHROMAX) 250 MG tablet    Sig: Take 2 tablets on day 1, then 1 tablet daily on days 2 through 5    Dispense:  6 tablet    Refill:  0   Discussed clinical and radiographic findings with patient today.  Agree that he is in need of new custom orthotics.  However, patient only wants 1 orthotic for the right foot.  He does not want 1 for the left foot.  A prescription was written  for [facility] prost[facility] to have 1 custom made orthotic with a soft Morton's extension to accommodate the hallux rigidus deformity of the right foot.  Offered a corticosteroid injection to the joint today but he refused.  Recommended Voltaren gel be applied twice daily and massaged in well to the painful joint.  Informed the patient we cannot ignore the infection to the right great toe.  Sterile nail nipper was utilized to cut back the distal lateral edge to obtain relief since he would not allow any type of procedure to be performed today.  Will also send in a Z-Pak for him to take all medication as prescribed over the next 5 days.  He may need to call if he is not having any  continued improvement of this toe to have a possible partial nail avulsion performed.  He was not interested in pursuing this today.  Follow-up as needed per patient request   Mareta Chesnut Orland Mustard, DPM, FACFAS [facility]     [address] [place], [place] [address]                Office [phone]  Fax [phone]

## 2023-06-08 ENCOUNTER — Encounter: Payer: Self-pay | Admitting: Adult Health

## 2023-06-08 ENCOUNTER — Other Ambulatory Visit (HOSPITAL_COMMUNITY): Payer: Self-pay

## 2023-06-08 ENCOUNTER — Ambulatory Visit (INDEPENDENT_AMBULATORY_CARE_PROVIDER_SITE_OTHER): Payer: Medicaid Other | Admitting: Adult Health

## 2023-06-08 VITALS — BP 125/67 | HR 61 | Ht 67.0 in

## 2023-06-08 DIAGNOSIS — I6381 Other cerebral infarction due to occlusion or stenosis of small artery: Secondary | ICD-10-CM

## 2023-06-08 DIAGNOSIS — G8929 Other chronic pain: Secondary | ICD-10-CM | POA: Diagnosis not present

## 2023-06-08 DIAGNOSIS — M5441 Lumbago with sciatica, right side: Secondary | ICD-10-CM

## 2023-06-08 DIAGNOSIS — G8111 Spastic hemiplegia affecting right dominant side: Secondary | ICD-10-CM | POA: Diagnosis not present

## 2023-06-08 MED ORDER — BACLOFEN 20 MG PO TABS
20.0000 mg | ORAL_TABLET | Freq: Three times a day (TID) | ORAL | 5 refills | Status: AC
Start: 2023-06-08 — End: ?

## 2023-06-08 NOTE — Patient Instructions (Addendum)
Continue working with PT   Increase baclofen to 20mg  three times daily gradually - start taking twice a day (morning and night) for the first couple days then add afternoon dose  If spasticity persists, would recommend looking into botox injections  If back pain persists, would recommend further discussion with your primary doctor to rule out other causes  Continue aspirin 81 mg daily  and Crestor  for secondary stroke prevention  Continue to follow up with PCP regarding cholesterol and blood pressure management  Maintain strict control of hypertension with blood pressure goal below 130/90 and cholesterol with LDL cholesterol (bad cholesterol) goal below 70 mg/dL.   Signs of a Stroke? Follow the BEFAST method:  Balance Watch for a sudden loss of balance, trouble with coordination or vertigo Eyes Is there a sudden loss of vision in one or both eyes? Or double vision?  Face: Ask the person to smile. Does one side of the face droop or is it numb?  Arms: Ask the person to raise both arms. Does one arm drift downward? Is there weakness or numbness of a leg? Speech: Ask the person to repeat a simple phrase. Does the speech sound slurred/strange? Is the person confused ? Time: If you observe any of these signs, call 911.     Followup in the future with me in 6 months or call earlier if needed      Thank you for coming to see [place] at [facility]. I hope we have been able to provide you high quality care today.  You may receive a patient satisfaction survey over the next few weeks. We would appreciate your feedback and comments so that we may continue to improve ourselves and the health of our patients.

## 2023-06-19 ENCOUNTER — Encounter (HOSPITAL_COMMUNITY): Payer: Self-pay

## 2023-06-19 ENCOUNTER — Emergency Department (HOSPITAL_COMMUNITY)
Admission: EM | Admit: 2023-06-19 | Discharge: 2023-06-21 | Disposition: A | Discharge status: Home / Self Care | Payer: Medicaid Other | Attending: Emergency Medicine | Admitting: Emergency Medicine

## 2023-06-19 ENCOUNTER — Emergency Department (HOSPITAL_COMMUNITY): Payer: Medicaid Other

## 2023-06-19 ENCOUNTER — Other Ambulatory Visit: Payer: Self-pay

## 2023-06-19 DIAGNOSIS — Z79899 Other long term (current) drug therapy: Secondary | ICD-10-CM | POA: Diagnosis not present

## 2023-06-19 DIAGNOSIS — I10 Essential (primary) hypertension: Secondary | ICD-10-CM | POA: Diagnosis not present

## 2023-06-19 DIAGNOSIS — Z7982 Long term (current) use of aspirin: Secondary | ICD-10-CM | POA: Diagnosis not present

## 2023-06-19 DIAGNOSIS — R109 Unspecified abdominal pain: Secondary | ICD-10-CM | POA: Insufficient documentation

## 2023-06-19 DIAGNOSIS — S3992XA Unspecified injury of lower back, initial encounter: Secondary | ICD-10-CM | POA: Diagnosis present

## 2023-06-19 DIAGNOSIS — W19XXXA Unspecified fall, initial encounter: Secondary | ICD-10-CM | POA: Diagnosis not present

## 2023-06-19 DIAGNOSIS — S300XXA Contusion of lower back and pelvis, initial encounter: Secondary | ICD-10-CM | POA: Insufficient documentation

## 2023-06-19 HISTORY — DX: Cerebral infarction, unspecified: I63.9

## 2023-06-19 LAB — CBC WITH DIFFERENTIAL/PLATELET
Abs Immature Granulocytes: 0.03 10*3/uL (ref 0.00–0.07)
Basophils Absolute: 0 10*3/uL (ref 0.0–0.1)
Basophils Relative: 0 %
Eosinophils Absolute: 0.2 10*3/uL (ref 0.0–0.5)
Eosinophils Relative: 2 %
HCT: 40.7 % (ref 39.0–52.0)
Hemoglobin: 13.3 g/dL (ref 13.0–17.0)
Immature Granulocytes: 0 %
Lymphocytes Relative: 22 %
Lymphs Abs: 2.2 10*3/uL (ref 0.7–4.0)
MCH: 30.6 pg (ref 26.0–34.0)
MCHC: 32.7 g/dL (ref 30.0–36.0)
MCV: 93.8 fL (ref 80.0–100.0)
Monocytes Absolute: 0.8 10*3/uL (ref 0.1–1.0)
Monocytes Relative: 8 %
Neutro Abs: 6.6 10*3/uL (ref 1.7–7.7)
Neutrophils Relative %: 68 %
Platelets: 178 10*3/uL (ref 150–400)
RBC: 4.34 MIL/uL (ref 4.22–5.81)
RDW: 12.1 % (ref 11.5–15.5)
WBC: 9.9 10*3/uL (ref 4.0–10.5)
nRBC: 0 % (ref 0.0–0.2)

## 2023-06-19 LAB — I-STAT CHEM 8, ED
BUN: 9 mg/dL (ref 6–20)
Calcium, Ion: 0.98 mmol/L — ABNORMAL LOW (ref 1.15–1.40)
Chloride: 110 mmol/L (ref 98–111)
Creatinine, Ser: 0.7 mg/dL (ref 0.61–1.24)
Glucose, Bld: 86 mg/dL (ref 70–99)
HCT: 35 % — ABNORMAL LOW (ref 39.0–52.0)
Hemoglobin: 11.9 g/dL — ABNORMAL LOW (ref 13.0–17.0)
Potassium: 3.2 mmol/L — ABNORMAL LOW (ref 3.5–5.1)
Sodium: 140 mmol/L (ref 135–145)
TCO2: 20 mmol/L — ABNORMAL LOW (ref 22–32)

## 2023-06-19 LAB — URINALYSIS, ROUTINE W REFLEX MICROSCOPIC
Bilirubin Urine: NEGATIVE
Glucose, UA: NEGATIVE mg/dL
Hgb urine dipstick: NEGATIVE
Ketones, ur: NEGATIVE mg/dL
Leukocytes,Ua: NEGATIVE
Nitrite: NEGATIVE
Protein, ur: NEGATIVE mg/dL
Specific Gravity, Urine: 1.017 (ref 1.005–1.030)
pH: 7 (ref 5.0–8.0)

## 2023-06-19 MED ORDER — BACLOFEN 10 MG PO TABS
20.0000 mg | ORAL_TABLET | Freq: Three times a day (TID) | ORAL | Status: DC
  Administered 2023-06-19 – 2023-06-21 (×5): 20 mg via ORAL
  Filled 2023-06-19 (×5): qty 2

## 2023-06-19 MED ORDER — HYDROCODONE-ACETAMINOPHEN 5-325 MG PO TABS
1.0000 | ORAL_TABLET | Freq: Once | ORAL | Status: AC
  Administered 2023-06-19: 1 via ORAL
  Filled 2023-06-19: qty 1

## 2023-06-19 MED ORDER — CARVEDILOL 12.5 MG PO TABS
25.0000 mg | ORAL_TABLET | Freq: Two times a day (BID) | ORAL | Status: DC
  Administered 2023-06-20 – 2023-06-21 (×2): 25 mg via ORAL
  Filled 2023-06-19 (×3): qty 2

## 2023-06-19 MED ORDER — AMLODIPINE BESYLATE 5 MG PO TABS
10.0000 mg | ORAL_TABLET | Freq: Every day | ORAL | Status: DC
  Administered 2023-06-19 – 2023-06-21 (×3): 10 mg via ORAL
  Filled 2023-06-19 (×3): qty 2

## 2023-06-19 MED ORDER — PANTOPRAZOLE SODIUM 40 MG PO TBEC
40.0000 mg | DELAYED_RELEASE_TABLET | Freq: Every day | ORAL | Status: DC
  Administered 2023-06-19 – 2023-06-21 (×3): 40 mg via ORAL
  Filled 2023-06-19 (×3): qty 1

## 2023-06-19 MED ORDER — IBUPROFEN 600 MG PO TABS: 600.0000 mg | ORAL_TABLET | Freq: Three times a day (TID) | ORAL | 0 refills | Status: AC

## 2023-06-19 MED ORDER — ROSUVASTATIN CALCIUM 20 MG PO TABS
40.0000 mg | ORAL_TABLET | Freq: Every day | ORAL | Status: DC
  Administered 2023-06-20: 40 mg via ORAL
  Filled 2023-06-19: qty 2

## 2023-06-19 MED ORDER — ASPIRIN 81 MG PO TBEC
81.0000 mg | DELAYED_RELEASE_TABLET | Freq: Every day | ORAL | Status: DC
  Administered 2023-06-19 – 2023-06-21 (×3): 81 mg via ORAL
  Filled 2023-06-19 (×3): qty 1

## 2023-06-19 MED ORDER — NAPROXEN 250 MG PO TABS
500.0000 mg | ORAL_TABLET | Freq: Once | ORAL | Status: AC
  Administered 2023-06-19: 500 mg via ORAL
  Filled 2023-06-19: qty 2

## 2023-06-19 MED ORDER — IBUPROFEN 400 MG PO TABS
600.0000 mg | ORAL_TABLET | Freq: Four times a day (QID) | ORAL | Status: DC | PRN
  Administered 2023-06-20: 600 mg via ORAL
  Filled 2023-06-19: qty 1

## 2023-06-19 MED ORDER — IOHEXOL 350 MG/ML SOLN
75.0000 mL | Freq: Once | INTRAVENOUS | Status: AC | PRN
  Administered 2023-06-19: 75 mL via INTRAVENOUS

## 2023-06-19 MED ORDER — POLYETHYLENE GLYCOL 3350 17 G PO PACK
17.0000 g | PACK | Freq: Every day | ORAL | Status: DC
  Administered 2023-06-21: 17 g via ORAL
  Filled 2023-06-19: qty 1

## 2023-06-19 MED ORDER — IRBESARTAN 75 MG PO TABS
75.0000 mg | ORAL_TABLET | Freq: Every day | ORAL | Status: DC
  Administered 2023-06-20 – 2023-06-21 (×2): 75 mg via ORAL
  Filled 2023-06-19 (×4): qty 1

## 2023-06-19 MED ORDER — ACETAMINOPHEN 500 MG PO TABS: 500.0000 mg | ORAL_TABLET | Freq: Four times a day (QID) | ORAL | 0 refills | Status: AC | PRN

## 2023-06-19 MED ORDER — ACETAMINOPHEN 325 MG PO TABS
650.0000 mg | ORAL_TABLET | Freq: Once | ORAL | Status: AC
  Administered 2023-06-19: 650 mg via ORAL
  Filled 2023-06-19: qty 2

## 2023-06-19 NOTE — Discharge Instructions (Addendum)
The x-rays are negative for any fracture. We do see thinning of the bone, and you should discuss this finding with your primary care doctor to see if you have osteoporosis.  Take the medications as prescribed for symptom management.

## 2023-06-19 NOTE — ED Notes (Signed)
Patient given dinner tray unable to eat as food had to be cut into small pieces for him as he is unable to do it himself. Once food cut patient able to eat.

## 2023-06-19 NOTE — ED Triage Notes (Signed)
Pt bib PTAR from home where he had a fall last night after losing his footing. Denies hitting his head or LOC. Pt is not on thinners and denies headache. Pt went to bed last night and was able to get up and walk this am but had a significant amount of pain in his lower abd and lower back. Last bm yesterday evening and urination is normal. Pt has right sided weakness from previous stroke and uses a walker at home. EMS VSS. AOx4. Resp EU

## 2023-06-19 NOTE — ED Notes (Signed)
Pt in CT.

## 2023-06-19 NOTE — ED Notes (Signed)
Pt in XR. 

## 2023-06-19 NOTE — ED Provider Notes (Addendum)
[place] EMERGENCY DEPARTMENT AT [facility] Provider Note   CSN: [id number] Arrival date & time: 06/19/23  1124     History  Chief Complaint  Patient presents with   Alex Grant is a 57 y.o. male.  HPI    57 year old male with history of hyperlipidemia, hypertension and stroke with right-sided deficits comes in with cc of fall.  Pt lost his balance and fell on to his back last night trying to avoid a bug. He took aleve for pain in his back and tail bone area. Pain more severe this morning and he has some abd discomfort as well, prompting him to come to the ER. He uses a hand walker to get around.  Denies head trauma and has no headaches, neck pain, nausea, vomiting, seizures, loss of consciousness or new visual complains, weakness, numbness, dizziness.  Home Medications Prior to Admission medications   Medication Sig Start Date End Date Taking? Authorizing Provider  acetaminophen (TYLENOL) 500 MG tablet Take 1 tablet (500 mg total) by mouth every 6 (six) hours as needed. 06/19/23  Yes Derwood Kaplan, MD  ibuprofen (ADVIL) 600 MG tablet Take 1 tablet (600 mg total) by mouth 3 (three) times daily. 06/19/23  Yes Amaury Kuzel, MD  amLODipine (NORVASC) 10 MG tablet Take 1 tablet (10 mg total) by mouth daily. 03/28/23   Adron Bene, MD  aspirin EC 81 MG tablet Take 1 tablet (81 mg total) by mouth daily. Swallow whole. 03/28/23 06/26/23  Adron Bene, MD  baclofen (LIORESAL) 20 MG tablet Take 1 tablet (20 mg total) by mouth 3 (three) times daily. 06/08/23   Ihor Austin, NP  bisacodyl (DULCOLAX) 5 MG EC tablet Take 1 tablet (5 mg total) by mouth 2 (two) times daily as needed for moderate constipation. 03/28/23   Adron Bene, MD  carvedilol (COREG) 25 MG tablet Take 1 tablet (25 mg total) by mouth 2 (two) times daily with a meal. 03/28/23   Adron Bene, MD  irbesartan (AVAPRO) 75 MG tablet Take 1 tablet (75 mg total) by mouth daily. 03/28/23 06/26/23  Adron Bene, MD  omeprazole (PRILOSEC) 20 MG capsule Take 20 mg by mouth daily.    [provider]  polyethylene glycol (MIRALAX / GLYCOLAX) 17 g packet Take 17 g by mouth daily.    [provider]  rosuvastatin (CRESTOR) 40 MG tablet Take 1 tablet (40 mg total) by mouth daily at 6 PM. 03/28/23   Adron Bene, MD      Allergies    Amoxil [amoxicillin]    Review of Systems   Review of Systems  All other systems reviewed and are negative.   Physical Exam Updated Vital Signs BP (!) 153/62 (BP Location: Right Arm)   Pulse 63   Temp 98.5 F (36.9 C) (Oral)   Resp 18   Ht 5\' 7"  (1.702 m)   Wt 68 kg   SpO2 96%   BMI 23.49 kg/m  Physical Exam Vitals and nursing note reviewed.  Constitutional:      Appearance: He is well-developed.  HENT:     Head: Atraumatic.  Neck:     Comments: No midline c-spine tenderness, pt able to turn head to 45 degrees bilaterally without any pain and able to flex neck to the chest and extend without any pain or neurologic symptoms.  Cardiovascular:     Rate and Rhythm: Normal rate.  Pulmonary:     Effort: Pulmonary effort is normal.  Breath sounds: Normal breath sounds.  Musculoskeletal:        General: Tenderness present. No deformity.     Cervical back: Neck supple.     Comments: Patient has tenderness over the lower lumbar spine and coccyx region. No ecchymosis over the flank region.  No tenderness over the ribs posteriorly or anteriorly  Skin:    General: Skin is warm.  Neurological:     Mental Status: He is alert and oriented to person, place, and time.     ED Results / Procedures / Treatments   Labs (all labs ordered are listed, but only abnormal results are displayed) Labs Reviewed  CBC WITH DIFFERENTIAL/PLATELET  URINALYSIS, ROUTINE W REFLEX MICROSCOPIC  I-STAT CHEM 8, ED    EKG None  Radiology DG Sacrum/Coccyx  Result Date: 06/19/2023 CLINICAL DATA:  Pain after fall EXAM: SACRUM AND COCCYX - 3 VIEW;  LUMBAR SPINE - COMPLETE 4 VIEW COMPARISON:  None Available. FINDINGS: Osteopenia. Five lumbar-type vertebral bodies. Preserved vertebral body height and disc height. No listhesis. Minimal endplate osteophytes. Preserved sacroiliac joints. Vascular calcifications in the pelvis. Presumed prostate calcifications overall recommend continue precautions until clinical clearance and if there is further concern of injury additional workup with CT as clinically directed for higher sensitivity. IMPRESSION: Osteopenia.  Mild degenerative change Electronically Signed   By: Karen Kays M.D.   On: 06/19/2023 13:06   DG Lumbar Spine Complete  Result Date: 06/19/2023 CLINICAL DATA:  Pain after fall EXAM: SACRUM AND COCCYX - 3 VIEW; LUMBAR SPINE - COMPLETE 4 VIEW COMPARISON:  None Available. FINDINGS: Osteopenia. Five lumbar-type vertebral bodies. Preserved vertebral body height and disc height. No listhesis. Minimal endplate osteophytes. Preserved sacroiliac joints. Vascular calcifications in the pelvis. Presumed prostate calcifications overall recommend continue precautions until clinical clearance and if there is further concern of injury additional workup with CT as clinically directed for higher sensitivity. IMPRESSION: Osteopenia.  Mild degenerative change Electronically Signed   By: Karen Kays M.D.   On: 06/19/2023 13:06    Procedures Procedures    Medications Ordered in ED Medications  naproxen (NAPROSYN) tablet 500 mg (500 mg Oral Given 06/19/23 1241)  HYDROcodone-acetaminophen (NORCO/VICODIN) 5-325 MG per tablet 1 tablet (1 tablet Oral Given 06/19/23 1241)    ED Course/ Medical Decision Making/ A&P Clinical Course as of 06/19/23 1718  Sun Jun 19, 2023  1409 Results of the ER workup discussed with patient.  He was stable for discharge.  However when we tried to ambulate him, he is complaining of severe abdominal pain.  Pain goes up to 9 out of 10 upon attempts to ambulate.  Pain is 3 out of 10 at  baseline.  It is unclear to me what is causing the pain. Given the blunt trauma and the pain starting after the trauma, we will get a CT abdomen pelvis with contrast.  Differential now also includes intra-abdominal hematoma, abdominal muscle tear [AN]  1718 Patient's CT is reassuring.  Results discussed with him. He is still apprehensive about going home.  Plan is for him to get Tylenol and Naprosyn right now.  He will be walked again at 6:30 PM.  He will then decide if he is comfortable going home or he needs to stay.  We will try ambulation again at that time. [AN]    Clinical Course User Index [AN] Derwood Kaplan, MD  Medical Decision Making Amount and/or Complexity of Data Reviewed Labs: ordered. Radiology: ordered.  Risk OTC drugs. Prescription drug management.  57 year old male with history of stroke, hypertension, hyperlipidemia and right-sided deficits comes in with chief complaint of fall.  He fell onto hardwood floor and injured his back in the process.  Patient is complaining of pain over the tailbone.  Differential diagnosis includes: - Fractures - Contusions - Soft tissue injury -Hematoma  Plan is to get x-ray of the coccyx and lumbar spine.  Brain and C-spine cleared clinically.  2:10 PM And interpreted patient's x-ray.  No evidence of fracture.  Radiologist confirms.  Patient has osteopenia, this finding has been discussed with him. We will ambulate him -and discharge if he is doing well.   7:02 PM Patient reassessed after CT scan.  He still was not comfortable going home.  I gave him Tylenol and ibuprofen at the same time and we reassessed him at 6:30 PM.  He still does not feel comfortable going home.  He feels little better.  Patient has a stroke history and cannot use right side well at baseline.  With this pain, his walk is even more difficult and he is high risk for fall.  Will order PT-OT evaluation.  He is not opposed  to going to rehab facility if needed to get better.  Final Clinical Impression(s) / ED Diagnoses Final diagnoses:  Coccyx contusion, initial encounter    Rx / DC Orders ED Discharge Orders          Ordered    ibuprofen (ADVIL) 600 MG tablet  3 times daily        06/19/23 1319    acetaminophen (TYLENOL) 500 MG tablet  Every 6 hours PRN        06/19/23 1319                Derwood Kaplan, MD 06/19/23 1903

## 2023-06-20 NOTE — ED Notes (Signed)
Pt is a&ox4, pwd to touch. Pt complains of abdominal pain 3/10 when still and not moving. Pt attached to monitor/vitals. Side rails up x2, call light within reach. Pt denies any other complaints.

## 2023-06-20 NOTE — TOC Initial Note (Signed)
Transition of Care [facility]) - Initial/Assessment Note    Patient Details  Name: Alex Grant MRN: [id number] Date of Birth: [birth year]  Transition of Care [facility]) CM/SW Contact:    Susa Simmonds, LCSWA Phone Number: 06/20/2023, 1:04 PM  Clinical Narrative:  CSW spoke with patient who stated that he will need to go to a SNF. Patient asked CSW if she can contact his stepmother and father to discuss placement options. CSW did tell patient he will be limited with the facilities due to his insurance. CSW spoke with patients stepmother Cyprus Gartner who told CSW that [facility]'t have [facility] year when he went to [facility]. Ms. Lucchesi stated she has to use patients 401K for him to be able to stay at [facility] term. Ms. Greenstone stated they currently do not have any extra money. Ms. Szot states patient receives home health weekly through the [facility]. Ms. Amstutz asked CSW to contact her with any facilities that's willing to accept patient.    CSW contacted Elvis Coil, patients stoical worker at [facility] and left a message requesting a call back.                    Barriers to Discharge: Continued Medical Work up   Patient Goals and CMS Choice Patient states their goals for this hospitalization and ongoing recovery are:: Return home after SNF          Expected Discharge Plan and Services       Living arrangements for the past 2 months: Single Family Home                                      Prior Living Arrangements/Services Living arrangements for the past 2 months: Single Family Home Lives with:: Self Patient language and need for interpreter reviewed:: Yes Do you feel safe going back to the place where you live?: No   Wants SNF  Need for Family Participation in Patient Care: Yes (Comment) Care giver support system in place?: Yes (comment) Current home services: Home PT Criminal Activity/Legal Involvement Pertinent to Current  Situation/Hospitalization: No - Comment as needed  Activities of Daily Living      Permission Sought/Granted   Permission granted to share information with : Yes, Verbal Permission Granted  Share Information with NAME: Cyprus Rothermel     Permission granted to share info w Relationship: Stepmother  Permission granted to share info w Contact Information: [phone]  Emotional Assessment Appearance:: Appears stated age Attitude/Demeanor/Rapport: Engaged Affect (typically observed): Calm Orientation: : Oriented to Self, Oriented to Place, Oriented to  Time, Oriented to Situation Alcohol / Substance Use: Not Applicable Psych Involvement: No (comment)  Admission diagnosis:  fall Patient Active Problem List   Diagnosis Date Noted   Sciatica 03/28/2023   HLD (hyperlipidemia) 04/18/2022   Acute ischemic stroke (HCC) 04/17/2022   Hypertension    Prediabetes    Right thyroid nodule    PCP:  Rocky Morel, DO Pharmacy:   [facility] [address] [place] - [address] [place] [address] Phone: [phone] Fax: [phone]  [facility] - [place], [place] - [address] [facility] [address] [place] [address] Phone: [phone] Fax: [phone]  [facility] [address] [place] [address]  Phone: [phone] Fax: [phone]     Social Determinants of Health (SDOH) Social History: SDOH Screenings   Depression (PHQ2-9): Low Risk  (03/28/2023)  Tobacco Use: Low Risk  (06/19/2023)   SDOH Interventions:     Readmission Risk Interventions     No data to display

## 2023-06-20 NOTE — Progress Notes (Signed)
CSW spoke with [place] in admissions at [facility] who can accept patient for placement. CSW received a call back from patients social worker Ebony Hail with [facility]. Valentina Gu stated her team will review the request and send all information to [place]. Valentina Gu is not sure if all this will be completed today. Valentina Gu stated [facility] will provide transportation to [place] when approval is made.

## 2023-06-20 NOTE — Evaluation (Signed)
Physical Therapy Evaluation Patient Details Name: Alex Grant MRN: [id number] DOB: [birth year] Today's Date: 06/20/2023  History of Present Illness  Pt is 57 yo male presenting to [facility] ED with reports of fall. Pt uses walker at baseline. PMH: hyperlipidemia, HTN and CVA with R sided deficits.  Clinical Impression  Pt is presenting below baseline level of functioning. Pt has baseline deficits due to old CVA. Pt reports he was Mod I for functional mobility and required minimal assistance with ADL's. Currently pt is CGA for rolling to the R and total assist for trying to sit EOB. Unable to attempt further mobility due to significant pain limiting mobility. Due to pt current functional status, home set up and available assistance at home recommending skilled physical therapy services <3 hours/day 5 days/week at a greater level of care and frequency in order to decrease risk for falls, injury and re-hospitalization to return home at Mod I.       If plan is discharge home, recommend the following: A lot of help with walking and/or transfers;Assist for transportation;Assistance with cooking/housework;Help with stairs or ramp for entrance   Can travel by private vehicle   No    Equipment Recommendations Other (comment) (defer to post acute)     Functional Status Assessment Patient has had a recent decline in their functional status and demonstrates the ability to make significant improvements in function in a reasonable and predictable amount of time.     Precautions / Restrictions Precautions Precautions: Fall Restrictions Weight Bearing Restrictions: No      Mobility  Bed Mobility Overal bed mobility: Needs Assistance Bed Mobility: Rolling Rolling: Contact guard assist, Total assist         General bed mobility comments: Pt was CGA rolling to the R and total assist rolling to the L. Attempted rolling R and sitting EOB. Pt was unable to progress to EOB in sitting due to pain.     Transfers       General transfer comment: Unable to get to sitting to get to standing.         Balance Overall balance assessment: History of Falls         Pertinent Vitals/Pain Pain Assessment Pain Assessment: 0-10 Pain Score: 3  Pain Location: abdomen in supine Pain Descriptors / Indicators: Aching Pain Intervention(s): Monitored during session    Home Living Family/patient expects to be discharged to:: Private residence Living Arrangements: Alone Available Help at Discharge: Family;Available PRN/intermittently Type of Home: House Home Access: Stairs to enter Entrance Stairs-Rails: Right;Left (in front) Entrance Stairs-Number of Steps: 3-4 in front, 1-2 in back   Home Layout: One level Home Equipment: Other (comment);Transport chair;Shower seat - built in;Grab bars - toilet;Grab bars - tub/shower;Lift chair (hemiwalker) Additional Comments: Pt states that he just started getting assistance from [facility] this week. M/W/F 2 hours/day    Prior Function     Mobility Comments: hemiwalker ADLs Comments: pt reports he needs help with the shower and help washing up. Needs some help with getting dressed.     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 He has one but does not like it. Weakness in RLE from prior CVA    Cervical / Trunk Assessment Cervical / Trunk Assessment: Normal  Communication  Communication Communication: Difficulty communicating thoughts/reduced clarity of speech Cueing Techniques: Verbal cues;Tactile cues  Cognition Arousal: Alert Behavior During Therapy: WFL for tasks assessed/performed Overall Cognitive Status: Within Functional Limits for tasks assessed                  Assessment/Plan    PT Assessment Patient needs continued PT services  PT Problem List Decreased strength;Decreased activity tolerance;Pain;Decreased mobility       PT Treatment Interventions DME instruction;Functional mobility training;Balance training;Patient/family education;Gait training;Therapeutic activities;Neuromuscular re-education;Therapeutic exercise;Stair training    PT Goals (Current goals can be found in the Care Plan section)  Acute Rehab PT Goals Patient Stated Goal: to decrease pain to improve mobility PT Goal Formulation: With patient Time For Goal Achievement: 07/04/23 Potential to Achieve Goals: Good    Frequency Min 1X/week        AM-PAC PT "6 Clicks" Mobility  Outcome Measure Help needed turning from your back to your side while in a flat bed without using bedrails?: A Lot Help needed moving from lying on your back to sitting on the side of a flat bed without using bedrails?: Total Help needed moving to and from a bed to a chair (including a wheelchair)?: Total Help needed standing up from a chair using your arms (e.g., wheelchair or bedside chair)?: Total Help needed to walk in hospital room?: Total Help needed climbing 3-5 steps with a railing? : Total 6 Click Score: 7    End of Session   Activity Tolerance: Patient limited by pain Patient left: in bed;with call bell/phone within reach Nurse Communication: Mobility status PT Visit Diagnosis: Other abnormalities of gait and mobility (R26.89)    Time: 9811-9147 PT Time Calculation (min) (ACUTE ONLY): 23 min   Charges:   PT Evaluation $PT Eval Low Complexity: 1 Low PT Treatments $Therapeutic Activity: 8-22 mins PT General Charges $$ ACUTE PT VISIT: 1 Visit        Harrel Carina, DPT, CLT  Acute Rehabilitation Services Office: [phone] (Secure chat preferred)   Claudia Desanctis 06/20/2023, 12:11 PM

## 2023-06-20 NOTE — ED Notes (Signed)
PT at bedside evaluating pt.

## 2023-06-20 NOTE — [place] FL2 (Signed)
[place] MEDICAID FL2 LEVEL OF CARE FORM     IDENTIFICATION  Patient Name: Alex Grant Birthdate: 1966/10/17 Sex: male Admission Date (Current Location): 06/19/2023  [facility] and [place] Number:  Producer, television/film/video and Address:  [facility] [place], [address], [place], [place] [address]      Provider Number: [id number]  Attending Physician Name and Address:  No att. providers found  Relative Name and Phone Number:  Ambus, Bocock (Stepmother)  [phone]    Current Level of Care: Hospital Recommended Level of Care: Skilled Nursing Facility Prior Approval Number:    Date Approved/Denied:   PASRR Number: [id number]  Discharge Plan: SNF    Current Diagnoses: Patient Active Problem List   Diagnosis Date Noted   Sciatica 03/28/2023   HLD (hyperlipidemia) 04/18/2022   Acute ischemic stroke (HCC) 04/17/2022   Hypertension    Prediabetes    Right thyroid nodule     Orientation RESPIRATION BLADDER Height & Weight     Self, Time, Situation, Place  Normal Continent Weight: 150 lb (68 kg) Height:  5\' 7"  (170.2 cm)  BEHAVIORAL SYMPTOMS/MOOD NEUROLOGICAL BOWEL NUTRITION STATUS      Continent Diet (Regular)  AMBULATORY STATUS COMMUNICATION OF NEEDS Skin   Extensive Assist Verbally Normal                       Personal Care Assistance Level of Assistance  Bathing, Feeding, Dressing Bathing Assistance: Limited assistance Feeding assistance: Independent (Eats with the left side due to past stroke) Dressing Assistance: Limited assistance     Functional Limitations Info  Sight, Hearing, Speech Sight Info: Adequate Hearing Info: Adequate Speech Info: Adequate    SPECIAL CARE FACTORS FREQUENCY                       Contractures Contractures Info: Not present    Additional Factors Info  Code Status, Allergies Code Status Info: Full Allergies Info: Amoxil (amoxicillin)           Current Medications (06/20/2023):  This is  the current hospital active medication list Current Facility-Administered Medications  Medication Dose Route Frequency Provider Last Rate Last Admin   amLODipine (NORVASC) tablet 10 mg  10 mg Oral Daily Rhunette Croft, Ankit, MD   10 mg at 06/20/23 1036   aspirin EC tablet 81 mg  81 mg Oral Daily Nanavati, Ankit, MD   81 mg at 06/20/23 1036   baclofen (LIORESAL) tablet 20 mg  20 mg Oral TID Derwood Kaplan, MD   20 mg at 06/20/23 1036   carvedilol (COREG) tablet 25 mg  25 mg Oral BID WC Nanavati, Ankit, MD   25 mg at 06/20/23 0726   ibuprofen (ADVIL) tablet 600 mg  600 mg Oral Q6H PRN Derwood Kaplan, MD   600 mg at 06/20/23 0726   irbesartan (AVAPRO) tablet 75 mg  75 mg Oral Daily Nanavati, Ankit, MD   75 mg at 06/20/23 1036   pantoprazole (PROTONIX) EC tablet 40 mg  40 mg Oral Daily Nanavati, Ankit, MD   40 mg at 06/20/23 1037   polyethylene glycol (MIRALAX / GLYCOLAX) packet 17 g  17 g Oral Daily Nanavati, Ankit, MD       rosuvastatin (CRESTOR) tablet 40 mg  40 mg Oral q1800 Nanavati, Ankit, MD       Current Outpatient Medications  Medication Sig Dispense Refill   acetaminophen (TYLENOL) 325 MG tablet Take 650 mg by mouth every 6 (  six) hours as needed for mild pain or moderate pain.     acetaminophen (TYLENOL) 500 MG tablet Take 1 tablet (500 mg total) by mouth every 6 (six) hours as needed. 30 tablet 0   amLODipine (NORVASC) 10 MG tablet Take 1 tablet (10 mg total) by mouth daily. 30 tablet 2   aspirin EC 81 MG tablet Take 1 tablet (81 mg total) by mouth daily. Swallow whole. 30 tablet 2   baclofen (LIORESAL) 20 MG tablet Take 1 tablet (20 mg total) by mouth 3 (three) times daily. 90 tablet 5   bisacodyl (DULCOLAX) 5 MG EC tablet Take 1 tablet (5 mg total) by mouth 2 (two) times daily as needed for moderate constipation. 30 tablet 2   carvedilol (COREG) 25 MG tablet Take 1 tablet (25 mg total) by mouth 2 (two) times daily with a meal. 60 tablet 2   ibuprofen (ADVIL) 600 MG tablet Take 1 tablet  (600 mg total) by mouth 3 (three) times daily. 15 tablet 0   irbesartan (AVAPRO) 75 MG tablet Take 1 tablet (75 mg total) by mouth daily. 30 tablet 2   omeprazole (PRILOSEC) 20 MG capsule Take 20 mg by mouth daily.     rosuvastatin (CRESTOR) 40 MG tablet Take 1 tablet (40 mg total) by mouth daily at 6 PM. 30 tablet 2   polyethylene glycol (MIRALAX / GLYCOLAX) 17 g packet Take 17 g by mouth daily.       Discharge Medications: Please see discharge summary for a list of discharge medications.  Relevant Imaging Results:  Relevant Lab Results:   Additional Information SS#: [id number]  Susa Simmonds, LCSWA

## 2023-06-21 NOTE — ED Notes (Signed)
PTAR has been scheduled for the patient.  ETA is about an  hour.  His nurse was informed by secure chat.

## 2023-06-21 NOTE — ED Notes (Signed)
EDP at bedside  

## 2023-06-21 NOTE — ED Provider Notes (Signed)
Emergency Medicine Observation Re-evaluation Note  Alex Grant is a 57 y.o. male, seen on rounds today.  Pt initially presented to the ED for complaints of Fall Currently, the patient is awaiting transport to skilled nursing facility.  He continues to have pain from his coccygeal contusion but denies any other new symptoms  Physical Exam  BP 135/66 (BP Location: Right Arm)   Pulse 67   Temp 98.5 F (36.9 C) (Oral)   Resp 18   Ht 5\' 7"  (1.702 m)   Wt 68 kg   SpO2 95%   BMI 23.49 kg/m  Physical Exam General: No acute distress Cardiac: Regular rate and rhythm, no murmurs rubs or gallops Lungs: Clear to auscultation bilaterally Psych: Acting appropriately  ED Course / MDM  EKG:   I have reviewed the labs performed to date as well as medications administered while in observation.  Recent changes in the last 24 hours include none.  Plan  Current plan is for transport to skilled nursing facility this afternoon.  The patient is medically cleared to do so.    Durwin Glaze, MD 06/21/23 1019

## 2023-06-21 NOTE — ED Notes (Signed)
Report called to Neysa Bonito, Charity fundraiser at [facility]

## 2023-06-21 NOTE — ED Notes (Signed)
Pt transported to [facility] via [place]. Pt departed ED a&ox4,vss,nad. All of belongings transported with pt at this time.

## 2024-01-02 ENCOUNTER — Ambulatory Visit: Payer: Medicaid Other | Admitting: Adult Health

## 2024-01-02 NOTE — Progress Notes (Deleted)
 [facility] [address] [place] [address] [phone]       STROKE FOLLOW UP NOTE  [name] Date of Birth:  Jul 12, 1966 Medical Record Number:  [id number]   Reason for visit: Stroke follow up    SUBJECTIVE:   CHIEF COMPLAINT:  No chief complaint on file.   HPI:   Update 01/02/2024 JM: Patient returns for 92-month stroke follow-up visit.  Doing well from stroke standpoint without new stroke/TIA symptoms.  Continued right spastic hemiparesis which has been stable.  Ambulates with hemiwalker, no recent falls.  Baclofen dosage increased at prior visit for spasticity interfering with therapy and gait ***.   Remains on aspirin and Crestor.  Routinely follows with [facility] for stroke risk factor management.      History provided for reference purposes only Update 06/08/2023 JM: Patient returns for follow-up visit unaccompanied, brought by transportation.  Previously residing at [facility] but returned home end of May, lives alone. Will be having aide assistance starting today to help with ADLs, M/W/F. He is now a patient of [facility], now on disability. Continued right spastic hemiparesis, denies much changes since prior visit but is now ambulatory with hemiwalker!  Denies any recent falls.  Currently working with PT at [facility] one day weekly. He continues to have right sided spasticity which has been interfering with therapy and also suspected to be causing low back pain with right-sided sciatica.  Currently taking baclofen 15 mg TID -questions if this can be increased, tolerating current dosage well.  Denies new stroke/TIA symptoms. Compliant on aspirin and Crestor.  Routinely follows with PCP for stroke risk factor management.  Update 10/14/2022 JM: Returns for 54-month stroke follow-up accompanied by his father and stepmother.  He was previously here with his brother but he unfortunately passed away recently.  He continues to reside at [facility].   Unfortunately, insurance stopped paying for therapy back in September and is currently nonambulatory.  He was previously taking some steps during therapy sessions.  Continued right spastic hemiparesis - does note some improvement of proximal arm weakness. Does continue to have increased tone in both arm and leg, denies much improvement on baclofen although tolerating current dose well. He has been having increased lower back pain with sciatica on right side but feels this is due to sitting in w/c for prolonged periods of time.  He and family are understandably frustrated that he has not been able to participate in therapies which he needs as his goal is to eventually return back home.  Prior to his brother passing, he was working on applying for Social Security disability, he was denied due to assets and now stepmother has taken over this process and plans on appealing denial. Denies new stroke/TIA symptoms.  Remains on aspirin and Crestor Blood pressure well-controlled  Initial visit 06/10/2022 JM: Patient is being seen for initial hospital follow-up accompanied by his brother.  Continues to reside at [facility].  Reports residual right-sided weakness and speech difficulty.  Currently working with therapies noting improvement of speech and leg weakness, denies much improvement of RUE. Does have some pain in right arm and leg. Wears arm brace at night. Yesterday, able to take 35 steps with use of PT assistance, hemiwalker and AFO brace.  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  Previously living on his own, was never  married nor had any children.  Denies any new stroke/TIA symptoms.   Completed 3 weeks DAPT, remains on aspirin alone as well as Crestor, denies side effects Blood pressure today 118/62  No further concerns at this time  Stroke admission 04/17/2022 Mr.  Alex Grant is a 58 y.o. male with history of hyperlipidemia and hypertension who presented on 04/17/2022 after being found down at home with right sided hemiplegia, left gaze deviation and aphasia as well as hypertensive urgency.  Personally reviewed hospitalization pertinent progress notes, lab work and imaging.  Evaluated by Dr. Roda Shutters for left BG/CR large infarct likely secondary to small vessel disease given uncontrolled risk factors however cardioembolic source cannot be completely ruled out due to size.  Recommended 30-day CardioNet monitor outpatient to rule out A-fib.  CTA head/neck negative LVO.  EF 65 to 70%.  LDL 217.  A1c 5.9.  Noncompliant with aspirin PTA, recommended DAPT for 3 weeks and aspirin alone as well as initiating Crestor 40 mg daily. BG gradually improved and placed on amlodipine and metoprolol with long-term BP goal normotensive range.  Evidence of rhabdomyolysis, AKI, dehydration, transaminitis,  leukocytosis and hypernatremia, treated with IV fluids and antibiotics with improvement.  Incidental finding of right thyroid nodule on imaging and recommend outpatient follow-up with PCP.  Residual deficits of dysphagia (Dys 1 HTL diet), dense expressive aphasia and right hemiplegia.  Therapies initially recommended CIR but due to lack of caregiver support recommended discharge to SNF.      PERTINENT IMAGING  Per hospitalization 04/17/2022 -04/27/2022 Code Stroke CT head Acute left basal ganglia infarct ASPECTS 7   CTA head & neck No LVO or hemodynamically significant stenosis CT perfusion infarct not seen, likely due to it being 24 hours old MRI acute perforator infarct in left basal ganglia and corona radiata, chronic small vessel ischemia 2D Echo EF 65-70%   LDL 217 HgbA1c 5.9 UDS neg    ROS:   14 system review of systems performed and negative with exception of those listed in HPI  PMH:  Past Medical History:  Diagnosis Date   High cholesterol    Hypertension    Stroke  (HCC)     PSH:  Past Surgical History:  Procedure Laterality Date   NO PAST SURGERIES      Social History:  Social History   Socioeconomic History   Marital status: Single    Spouse name: Not on file   Number of children: Not on file   Years of education: Not on file   Highest education level: Not on file  Occupational History   Not on file  Tobacco Use   Smoking status: Never   Smokeless tobacco: Never  Substance and Sexual Activity   Alcohol use: No   Drug use: No   Sexual activity: Not on file  Other Topics Concern   Not on file  Social History Narrative   Not on file   Social Drivers of Health   Financial Resource Strain: Not on file  Food Insecurity: Not on file  Transportation Needs: Not on file  Physical Activity: Not on file  Stress: Not on file  Social Connections: Not on file  Intimate Partner Violence: Not on file    Family History:  Family History  Problem Relation Age of Onset   Diabetes Mellitus II Father    CAD Other     Medications:   Current Outpatient Medications on File Prior to Visit  Medication Sig Dispense Refill   acetaminophen (TYLENOL) 325  MG tablet Take 650 mg by mouth every 6 (six) hours as needed for mild pain or moderate pain.     acetaminophen (TYLENOL) 500 MG tablet Take 1 tablet (500 mg total) by mouth every 6 (six) hours as needed. 30 tablet 0   amLODipine (NORVASC) 10 MG tablet Take 1 tablet (10 mg total) by mouth daily. 30 tablet 2   baclofen (LIORESAL) 20 MG tablet Take 1 tablet (20 mg total) by mouth 3 (three) times daily. 90 tablet 5   bisacodyl (DULCOLAX) 5 MG EC tablet Take 1 tablet (5 mg total) by mouth 2 (two) times daily as needed for moderate constipation. 30 tablet 2   carvedilol (COREG) 25 MG tablet Take 1 tablet (25 mg total) by mouth 2 (two) times daily with a meal. 60 tablet 2   ibuprofen (ADVIL) 600 MG tablet Take 1 tablet (600 mg total) by mouth 3 (three) times daily. 15 tablet 0   irbesartan (AVAPRO) 75 MG  tablet Take 1 tablet (75 mg total) by mouth daily. 30 tablet 2   omeprazole (PRILOSEC) 20 MG capsule Take 20 mg by mouth daily.     rosuvastatin (CRESTOR) 40 MG tablet Take 1 tablet (40 mg total) by mouth daily at 6 PM. 30 tablet 2   No current facility-administered medications on file prior to visit.    Allergies:   Allergies  Allergen Reactions   Amoxil [Amoxicillin] Hives      OBJECTIVE:  Physical Exam  There were no vitals filed for this visit.   There is no height or weight on file to calculate BMI. No results found.  General: well developed, well nourished, very pleasant middle-age Caucasian male, seated, in no evident distress Head: head normocephalic and atraumatic.   Neck: supple with no carotid or supraclavicular bruits Cardiovascular: regular rate and rhythm, no murmurs Musculoskeletal: no deformity Skin:  no rash/petichiae Vascular:  Normal pulses all extremities   Neurologic Exam Mental Status: Awake and fully alert.  Mild dysarthria and occasional word finding difficulty.  Oriented to place and time. Recent and remote memory intact. Attention span, concentration and fund of knowledge appropriate. Mood and affect appropriate.  Cranial Nerves: Pupils equal, briskly reactive to light. Extraocular movements full without nystagmus. Visual fields full to confrontation. Hearing intact. Facial sensation intact.  Right lower facial weakness.  Tongue, palate moves normally and symmetrically.  Motor: Normal strength, bulk and tone left upper and lower extremity RUE: 2/5 proximal, 4/5 elbow extension and flexion, 2/5 distal with increased tone throughout greater distally, inability to extend fingers without assistance RLE: 4/5 HF, 4+/5 knee extension and flexion, 2/5 ADF, 4/5 APF, increased tone throughout greater distally Sensory.: intact to touch , pinprick , position and vibratory sensation.  Coordination: Rapid alternating movements normal on left side. Finger-to-nose  and heel-to-shin performed accurately on left side. Gait and Station: Stands from seated position with mild difficulty.  Slow cautious hemiplegic gait with use of hemiwalker.  Tandem walk and heel toe not attempted. Reflexes: 3+ right upper and lower extremity, 2+ left upper and lower extremity. Toes downgoing.       ASSESSMENT: Alex Grant is a 58 y.o. 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 Vascular risk factors include uncontrolled HTN, uncontrolled HLD, pre-DM, noncompliant with medication and poor diet.      PLAN:  Large left BG/CR stroke:  Residual deficit:  Right spastic hemiparesis, gait impairment and speech impairment.  Continue working with PT at [facility], has made progress since prior visit!  Can increase baclofen to 20mg  TID gradually but did discuss this would be the max dose and discussed potential side effects, he verbalized understanding and wished to proceed.  Discussed consideration of Botox to further assist with spasticity but declines interest at this time. Lower back pain with sciatica possibly in setting of spastic hemiparesis, advised if symptoms persist despite working with PT, would recommend f/u with PCP to evaluate for other potential contributing causes 30-day cardiac monitor negative for A-fib Hypercoagulable labs negative except factor V Leiden.  No history of DVT/PE.  Continue aspirin 81 mg daily  and Crestor 40 mg daily for secondary stroke prevention.  Discussed importance of medication compliance and use of these medications lifelong unless contraindicated in the future Discussed secondary stroke prevention measures and importance of close PCP follow up for aggressive stroke risk factor management including BP goal<130/90, HLD with LDL goal<70 and pre-DM with A1c.<7  Stroke labs 03/2023: 5.7, 50 I have gone  over the pathophysiology of stroke, warning signs and symptoms, risk factors and their management in some detail with instructions to go to the closest emergency room for symptoms of concern.     Follow up in 6 months or call earlier if needed     I spent 30 minutes of face-to-face and non-face-to-face time with patient.  This included previsit chart review, lab review, study review, order entry, electronic health record documentation, patient and family education and discussion regarding above diagnoses and treatment plan and answered all the questions to patient and family's satisfaction  Ihor Austin, [facility]  [facility] [address] [place], [place] [address]  Phone [phone] Fax [phone] Note: This document was prepared with digital dictation and possible smart phrase technology. Any transcriptional errors that result from this process are unintentional.

## 2024-01-05 NOTE — Progress Notes (Signed)
 [facility] [address] [place]. [place] [address] [phone]       STROKE FOLLOW UP NOTE  [name] Date of Birth:  [birth year] Medical Record Number:  [id number]   Reason for visit: Stroke follow up    SUBJECTIVE:   CHIEF COMPLAINT:  Chief Complaint  Patient presents with   Transient Ischemic Attack    RM 3 alone Pt is well, still having some R sided weakness but overall stable.  No new concerns.     HPI:   Update 01/09/2024 JM: Patient returns for 18-month stroke follow-up visit unaccompanied.  Doing well from stroke standpoint without new stroke/TIA symptoms.  Continued right spastic hemiparesis which has been stable.  Reports increased dose of baclofen helpful for spasticity, denies side effects.  Ambulates with hemiwalker, no recent falls.  Completed PT/OT, tries to do HEP but not consistent.  Continues to work with speech therapy at [facility] for residual dysarthria.  No new stroke/TIA symptoms. Remains on aspirin and Crestor.  Routinely follows with [facility] for stroke risk factor management.    History provided for reference purposes only Update 06/08/2023 JM: Patient returns for follow-up visit unaccompanied, brought by transportation.  Previously residing at [facility] but returned home end of May, lives alone. Will be having aide assistance starting today to help with ADLs, M/W/F. He is now a patient of [facility], now on disability. Continued right spastic hemiparesis, denies much changes since prior visit but is now ambulatory with hemiwalker!  Denies any recent falls.  Currently working with PT at [facility] one day weekly. He continues to have right sided spasticity which has been interfering with therapy and also suspected to be causing low back pain with right-sided sciatica.  Currently taking baclofen 15 mg TID -questions if this can be increased, tolerating current dosage well.  Denies new stroke/TIA symptoms. Compliant on aspirin and Crestor.   Routinely follows with PCP for stroke risk factor management.  Update 10/14/2022 JM: Returns for 70-month stroke follow-up accompanied by his father and stepmother.  He was previously here with his brother but he unfortunately passed away recently.  He continues to reside at [facility].  Unfortunately, insurance stopped paying for therapy back in September and is currently nonambulatory.  He was previously taking some steps during therapy sessions.  Continued right spastic hemiparesis - does note some improvement of proximal arm weakness. Does continue to have increased tone in both arm and leg, denies much improvement on baclofen although tolerating current dose well. He has been having increased lower back pain with sciatica on right side but feels this is due to sitting in w/c for prolonged periods of time.  He and family are understandably frustrated that he has not been able to participate in therapies which he needs as his goal is to eventually return back home.  Prior to his brother passing, he was working on applying for Social Security disability, he was denied due to assets and now stepmother has taken over this process and plans on appealing denial. Denies new stroke/TIA symptoms.  Remains on aspirin and Crestor Blood pressure well-controlled  Initial visit 06/10/2022 JM: Patient is being seen for initial hospital follow-up accompanied by his brother.  Continues to reside at [facility].  Reports residual right-sided weakness and speech difficulty.  Currently working with therapies noting improvement of speech and leg weakness, denies much improvement of RUE. Does have some pain in right arm and leg. Wears arm brace at night. Yesterday,  able to take 35 steps with use of PT assistance, hemiwalker and AFO brace.  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   Previously living on his own, was never married nor had any children.  Denies any new stroke/TIA symptoms.   Completed 3 weeks DAPT, remains on aspirin alone as well as Crestor, denies side effects Blood pressure today 118/62  No further concerns at this time  Stroke admission 04/17/2022 Alex Grant is a 58 y.o. male with history of hyperlipidemia and hypertension who presented on 04/17/2022 after being found down at home with right sided hemiplegia, left gaze deviation and aphasia as well as hypertensive urgency.  Personally reviewed hospitalization pertinent progress notes, lab work and imaging.  Evaluated by Dr. Roda Shutters for left BG/CR large infarct likely secondary to small vessel disease given uncontrolled risk factors however cardioembolic source cannot be completely ruled out due to size.  Recommended 30-day CardioNet monitor outpatient to rule out A-fib.  CTA head/neck negative LVO.  EF 65 to 70%.  LDL 217.  A1c 5.9.  Noncompliant with aspirin PTA, recommended DAPT for 3 weeks and aspirin alone as well as initiating Crestor 40 mg daily. BG gradually improved and placed on amlodipine and metoprolol with long-term BP goal normotensive range.  Evidence of rhabdomyolysis, AKI, dehydration, transaminitis,  leukocytosis and hypernatremia, treated with IV fluids and antibiotics with improvement.  Incidental finding of right thyroid nodule on imaging and recommend outpatient follow-up with PCP.  Residual deficits of dysphagia (Dys 1 HTL diet), dense expressive aphasia and right hemiplegia.  Therapies initially recommended CIR but due to lack of caregiver support recommended discharge to SNF.      PERTINENT IMAGING  Per hospitalization 04/17/2022 -04/27/2022 Code Stroke CT head Acute left basal ganglia infarct ASPECTS 7   CTA head & neck No LVO or hemodynamically significant stenosis CT perfusion infarct not seen, likely due to it being 24 hours old MRI acute perforator infarct in left basal ganglia and  corona radiata, chronic small vessel ischemia 2D Echo EF 65-70%   LDL 217 HgbA1c 5.9 UDS neg    ROS:   14 system review of systems performed and negative with exception of those listed in HPI  PMH:  Past Medical History:  Diagnosis Date   High cholesterol    Hypertension    Stroke (HCC)     PSH:  Past Surgical History:  Procedure Laterality Date   NO PAST SURGERIES      Social History:  Social History   Socioeconomic History   Marital status: Single    Spouse name: Not on file   Number of children: Not on file   Years of education: Not on file   Highest education level: Not on file  Occupational History   Not on file  Tobacco Use   Smoking status: Never   Smokeless tobacco: Never  Substance and Sexual Activity   Alcohol use: No   Drug use: No   Sexual activity: Not on file  Other Topics Concern   Not on file  Social History Narrative   Not on file   Social Drivers of Health   Financial Resource Strain: Not on file  Food Insecurity: Not on file  Transportation Needs: Not on file  Physical Activity: Not on file  Stress: Not on file  Social Connections: Not on file  Intimate Partner Violence: Not on  file    Family History:  Family History  Problem Relation Age of Onset   Diabetes Mellitus II Father    CAD Other     Medications:   Current Outpatient Medications on File Prior to Visit  Medication Sig Dispense Refill   acetaminophen (TYLENOL) 325 MG tablet Take 650 mg by mouth every 6 (six) hours as needed for mild pain or moderate pain.     acetaminophen (TYLENOL) 500 MG tablet Take 1 tablet (500 mg total) by mouth every 6 (six) hours as needed. 30 tablet 0   amLODipine (NORVASC) 10 MG tablet Take 1 tablet (10 mg total) by mouth daily. 30 tablet 2   aspirin EC 81 MG tablet Take 81 mg by mouth daily. Swallow whole.     baclofen (LIORESAL) 20 MG tablet Take 1 tablet (20 mg total) by mouth 3 (three) times daily. 90 tablet 5   bisacodyl (DULCOLAX) 5  MG EC tablet Take 1 tablet (5 mg total) by mouth 2 (two) times daily as needed for moderate constipation. 30 tablet 2   carvedilol (COREG) 25 MG tablet Take 1 tablet (25 mg total) by mouth 2 (two) times daily with a meal. 60 tablet 2   ibuprofen (ADVIL) 600 MG tablet Take 1 tablet (600 mg total) by mouth 3 (three) times daily. 15 tablet 0   irbesartan (AVAPRO) 75 MG tablet Take 1 tablet (75 mg total) by mouth daily. 30 tablet 2   omeprazole (PRILOSEC) 20 MG capsule Take 20 mg by mouth daily.     rosuvastatin (CRESTOR) 40 MG tablet Take 1 tablet (40 mg total) by mouth daily at 6 PM. 30 tablet 2   No current facility-administered medications on file prior to visit.    Allergies:   Allergies  Allergen Reactions   Amoxil [Amoxicillin] Hives      OBJECTIVE:  Physical Exam  Vitals:   01/09/24 1059  BP: 123/66  Pulse: 66  Weight: 183 lb (83 kg)  Height: 5\' 7"  (1.702 m)   Body mass index is 28.66 kg/m. No results found.  General: well developed, well nourished, very pleasant middle-age Caucasian male, seated, in no evident distress Head: head normocephalic and atraumatic.   Neck: supple with no carotid or supraclavicular bruits Cardiovascular: regular rate and rhythm, no murmurs Musculoskeletal: no deformity Skin:  no rash/petichiae Vascular:  Normal pulses all extremities   Neurologic Exam Mental Status: Awake and fully alert.  Mild dysarthria and occasional word finding difficulty.  Oriented to place and time. Recent and remote memory intact. Attention span, concentration and fund of knowledge appropriate. Mood and affect appropriate.  Cranial Nerves: Pupils equal, briskly reactive to light. Extraocular movements full without nystagmus. Visual fields full to confrontation. Hearing intact. Facial sensation intact.  Right lower facial weakness.  Tongue, palate moves normally and symmetrically.  Motor: Normal strength, bulk and tone left upper and lower extremity RUE: 2/5  proximal, 4/5 elbow extension and flexion, 2/5 hand grip with increased tone throughout greater distally, inability to extend fingers without assistance RLE: 4/5 HF, 4+/5 knee extension and flexion, 2/5 ADF, 4/5 APF, AFO in place, increased tone throughout greater distally Sensory.: intact to touch , pinprick , position and vibratory sensation.  Coordination: Rapid alternating movements normal on left side. Finger-to-nose and heel-to-shin performed accurately on left side. Gait and Station: deferred, in w/c Reflexes: 3+ right upper and lower extremity, 2+ left upper and lower extremity. Toes downgoing.       ASSESSMENT: Alex Grant is a  58 y.o. 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 Vascular risk factors include uncontrolled HTN, uncontrolled HLD, pre-DM, noncompliant with medication and poor diet.      PLAN:  Large left BG/CR stroke:  Residual deficit: Right spastic hemiparesis, gait impairment and speech impairment.  Continue working with SLP at [facility], discussed importance of doing HEP continuously.  Continue baclofen 20 mg TID, [facility] currently filling.  Continues to decline interest in Botox, discussed potential benefit with the Vivistim but declines interest.  He was advised to call if interested in pursuing prior to next follow-up visit 30-day cardiac monitor negative for A-fib Hypercoagulable labs negative except factor V Leiden.  No history of DVT/PE.  Continue aspirin 81 mg daily  and Crestor 40 mg daily for secondary stroke prevention managed/prescribed by [facility].  Discussed importance of medication compliance and use of these medications lifelong unless contraindicated in the future Discussed secondary stroke prevention measures and importance of close PCP follow up for aggressive stroke risk factor management including BP  goal<130/90, HLD with LDL goal<70 and pre-DM with A1c.<7  I have gone over the pathophysiology of stroke, warning signs and symptoms, risk factors and their management in some detail with instructions to go to the closest emergency room for symptoms of concern.     Follow up in 6 months or call earlier if needed     I spent 30 minutes of face-to-face and non-face-to-face time with patient.  This included previsit chart review, lab review, study review, order entry, electronic health record documentation, patient education and discussion regarding above diagnoses and treatment plan and answered all the questions to patient satisfaction  Ihor Austin, [facility]  [facility] [address] [place], [place] [address]  Phone [phone] Fax [phone] Note: This document was prepared with digital dictation and possible smart phrase technology. Any transcriptional errors that result from this process are unintentional.

## 2024-01-09 ENCOUNTER — Ambulatory Visit (INDEPENDENT_AMBULATORY_CARE_PROVIDER_SITE_OTHER): Admitting: Adult Health

## 2024-01-09 ENCOUNTER — Encounter: Payer: Self-pay | Admitting: Adult Health

## 2024-01-09 VITALS — BP 123/66 | HR 66 | Ht 67.0 in | Wt 183.0 lb

## 2024-01-09 DIAGNOSIS — G8111 Spastic hemiplegia affecting right dominant side: Secondary | ICD-10-CM

## 2024-01-09 DIAGNOSIS — I6381 Other cerebral infarction due to occlusion or stenosis of small artery: Secondary | ICD-10-CM

## 2024-01-09 NOTE — Patient Instructions (Signed)
 Continue baclofen 20mg  three times daily for spasticity  Continue working with speech therapy  Continue to do exercises as advised by physical and occupational therapy - please let me know if you are interested in pursuing further evaluation for either Botox to help with spasticity/tightness or evaluation to Vivistim   Continue aspirin 81 mg daily  and Crestor  for secondary stroke prevention  Continue to follow up with PCP regarding cholesterol and blood pressure management  Maintain strict control of hypertension with blood pressure goal below 130/90 and cholesterol with LDL cholesterol (bad cholesterol) goal below 70 mg/dL.   Signs of a Stroke? Follow the BEFAST method:  Balance Watch for a sudden loss of balance, trouble with coordination or vertigo Eyes Is there a sudden loss of vision in one or both eyes? Or double vision?  Face: Ask the person to smile. Does one side of the face droop or is it numb?  Arms: Ask the person to raise both arms. Does one arm drift downward? Is there weakness or numbness of a leg? Speech: Ask the person to repeat a simple phrase. Does the speech sound slurred/strange? Is the person confused ? Time: If you observe any of these signs, call 911.      Followup in the future with me in 6 months or call earlier if needed       Thank you for coming to see [place] at [facility]. I hope we have been able to provide you high quality care today.  You may receive a patient satisfaction survey over the next few weeks. We would appreciate your feedback and comments so that we may continue to improve ourselves and the health of our patients.

## 2024-02-29 ENCOUNTER — Ambulatory Visit (INDEPENDENT_AMBULATORY_CARE_PROVIDER_SITE_OTHER): Admitting: Podiatrist

## 2024-02-29 ENCOUNTER — Other Ambulatory Visit (HOSPITAL_COMMUNITY): Payer: Self-pay

## 2024-02-29 DIAGNOSIS — L600 Ingrowing nail: Secondary | ICD-10-CM

## 2024-02-29 MED ORDER — MUPIROCIN 2 % EX OINT
1.0000 | TOPICAL_OINTMENT | Freq: Two times a day (BID) | CUTANEOUS | 2 refills | Status: AC
  Filled 2024-02-29: qty 22, 11d supply, fill #0

## 2024-02-29 MED ORDER — DOXYCYCLINE HYCLATE 100 MG PO TABS
100.0000 mg | ORAL_TABLET | Freq: Two times a day (BID) | ORAL | 0 refills | Status: AC
  Filled 2024-02-29: qty 20, 10d supply, fill #0

## 2024-02-29 NOTE — Patient Instructions (Signed)
Soak Instructions    THE DAY AFTER THE PROCEDURE  Place 1/4 cup of epsom salts in a quart of warm tap water.  Submerge your foot or feet with outer bandage intact for the initial soak; this will allow the bandage to become moist and wet for easy lift off.  Once you remove your bandage, continue to soak in the solution for 20 minutes.  .  Next, remove your foot or feet from solution, blot dry the affected area and cover.  Apply mupirocin ointment (RX), or polysporin   You may use a band aid large enough to cover the area or use gauze and tape.    **This soak should be done twice a day for the next 5-7 days.- after that time you may switch to keeping it clean with soap and water in the shower.  You may discontinue use of the bandaid at night after about 2 weeks.  You may discontinue use of the bandaid during the day in shoes when there is no drainage on the bandaid.   IF YOUR SKIN BECOMES IRRITATED WHILE USING THESE INSTRUCTIONS, IT IS OKAY TO SWITCH TO  antibacterial soap pump soap (Dial)  and water to keep the toe clean instead of soaking in epsom salts.      Long Term Care Instructions-Post Nail Surgery  You have had your ingrown toenail and root treated with a chemical.  This chemical causes a burn that will drain and ooze like a blister.  1-2 weeks after the procedure you may leave the area open to air at night to help dry it up.  During the day,  It is important to keep this area clean and covered until the toe dries out and forms a scab. Once the scab forms you no longer need to soak or apply a dressing.  If at any time you experience an increase in pain, redness, swelling, or drainage, you should contact the office as soon as possible.  

## 2024-02-29 NOTE — Progress Notes (Signed)
 Chief Complaint  Patient presents with   Toe Pain    Bilateral great toe ingrown nails. Swollen for 6 months. O pain only with pressure. Non diabetic. Pt. Had stroke 2 yrs ago. Wears a brace on right foot.     HPI: Patient is 58 y.o. male who presents today for  ingrown toenail left hallux is worse than right.  Redness and swelling of the left hallux is noted.  Relates pain with pressure.    Patient Active Problem List   Diagnosis Date Noted   Sciatica 03/28/2023   HLD (hyperlipidemia) 04/18/2022   Acute ischemic stroke (HCC) 04/17/2022   Hypertension    Prediabetes    Right thyroid  nodule     Current Outpatient Medications on File Prior to Visit  Medication Sig Dispense Refill   acetaminophen  (TYLENOL ) 325 MG tablet Take 650 mg by mouth every 6 (six) hours as needed for mild pain or moderate pain.     acetaminophen  (TYLENOL ) 500 MG tablet Take 1 tablet (500 mg total) by mouth every 6 (six) hours as needed. 30 tablet 0   amLODipine  (NORVASC ) 10 MG tablet Take 1 tablet (10 mg total) by mouth daily. 30 tablet 2   aspirin  EC 81 MG tablet Take 81 mg by mouth daily. Swallow whole.     baclofen  (LIORESAL ) 20 MG tablet Take 1 tablet (20 mg total) by mouth 3 (three) times daily. 90 tablet 5   bisacodyl  (DULCOLAX) 5 MG EC tablet Take 1 tablet (5 mg total) by mouth 2 (two) times daily as needed for moderate constipation. 30 tablet 2   carvedilol  (COREG ) 25 MG tablet Take 1 tablet (25 mg total) by mouth 2 (two) times daily with a meal. 60 tablet 2   ibuprofen  (ADVIL ) 600 MG tablet Take 1 tablet (600 mg total) by mouth 3 (three) times daily. 15 tablet 0   irbesartan  (AVAPRO ) 75 MG tablet Take 1 tablet (75 mg total) by mouth daily. 30 tablet 2   omeprazole (PRILOSEC) 20 MG capsule Take 20 mg by mouth daily.     rosuvastatin  (CRESTOR ) 40 MG tablet Take 1 tablet (40 mg total) by mouth daily at 6 PM. 30 tablet 2   No current facility-administered medications on file prior to visit.    Allergies   Allergen Reactions   Amoxil [Amoxicillin] Hives    Review of Systems No fevers, chills, nausea, muscle aches, no difficulty breathing, no calf pain, no chest pain or shortness of breath.   Physical Exam  GENERAL APPEARANCE: Alert, conversant. Appropriately groomed. No acute distress.   VASCULAR: Pedal pulses palpable 2/4 DP and 2/4 PT bilateral.  Capillary refill time is immediate to all digits,  Proximal to distal cooling is warm to warm.  Digital perfusion adequate.   NEUROLOGIC: sensation is intact to 5.07 monofilament at 5/5 sites bilateral.  Light touch is intact bilateral, vibratory sensation intact bilateral  MUSCULOSKELETAL: acceptable muscle strength, tone and stability bilateral.  No gross boney pedal deformities noted.  No pain, crepitus or limitation noted with foot and ankle range of motion bilateral.   DERMATOLOGIC: Significant ingrown nail deformities noted left hallux with redness, swelling, proud flesh present, malodor and bloody type drainage noted.  Ingrown nail deformity of the right hallux is also present but less severe.  Some discomfort with direct pressure noted bilateral.   Assessment    ICD-10-CM   1. Ingrown left greater toenail  L60.0        Plan  Treatment options and alternatives  were discussed. Recommended a permanent removal of the medial lateral borders of the left hallux nail.  Patient agreed. Skin was prepped with alcohol and a local injection of lidocaine and Marcaine plain was infiltrated to anesthetize the toe. The toe was then prepped with Betadine and exsanguinated. The offending nail border was removed and phenol applied to the exposed matrix tissue.  The area was then cleansed well with alcohol.  Antibiotic ointment and a dressing was then applied the tourniquet released  noting a prompt hyperemic response to the tip of the toe.  Oral and written instructions were dispensed and the patient was instructed on aftercare.  Prescription forThere  doxycycline  doxycycline  and mupirocin  ointment were called into his pharmacy.recommended soaking both feet in Epsom salt water.  He will be seen back in 1 week for follow-up if there is no improvement in the right hallux nail will do a similar procedure at the next visit.  He will call if any questions or concerns arise.

## 2024-03-01 ENCOUNTER — Other Ambulatory Visit (HOSPITAL_COMMUNITY): Payer: Self-pay

## 2024-03-02 ENCOUNTER — Telehealth: Payer: Self-pay | Admitting: Podiatrist

## 2024-03-02 NOTE — Telephone Encounter (Signed)
 Patient father called to inform Dr. Maybelle Spatz, stated cream prescribed for patient is burning the patient foot. Please contact his father at [phone]

## 2024-03-14 ENCOUNTER — Ambulatory Visit (INDEPENDENT_AMBULATORY_CARE_PROVIDER_SITE_OTHER): Admitting: Podiatrist

## 2024-03-14 DIAGNOSIS — L600 Ingrowing nail: Secondary | ICD-10-CM

## 2024-03-14 NOTE — Patient Instructions (Signed)
 Soak Instructions    THE DAY AFTER THE PROCEDURE  Place 1/4 cup of epsom salts in a quart of warm tap water.  Submerge your foot or feet with outer bandage intact for the initial soak; this will allow the bandage to become moist and wet for easy lift off.  Once you remove your bandage, continue to soak in the solution for 20 minutes.  .  Next, remove your foot or feet from solution, blot dry the affected area and cover.  Apply antibiotic ointment such as neosporin or polysporin   You may use a band aid large enough to cover the area or use gauze and tape.    **This soak should be done twice a day for the next 5-7 days.- after that time you may switch to keeping it clean with soap and water in the shower.  You may discontinue use of the bandaid at night after about 2 weeks.  You may discontinue use of the bandaid during the day in shoes when there is no drainage on the bandaid.   IF YOUR SKIN BECOMES IRRITATED WHILE USING THESE INSTRUCTIONS, IT IS OKAY TO SWITCH TO  antibacterial soap pump soap (Dial)  and water to keep the toe clean instead of soaking in epsom salts.      Long Term Care Instructions-Post Nail Surgery  You have had your ingrown toenail and root treated with a chemical.  This chemical causes a burn that will drain and ooze like a blister.  1-2 weeks after the procedure you may leave the area open to air at night to help dry it up.  During the day,  It is important to keep this area clean and covered until the toe dries out and forms a scab. Once the scab forms you no longer need to soak or apply a dressing.  If at any time you experience an increase in pain, redness, swelling, or drainage, you should contact the office as soon as possible.

## 2024-03-14 NOTE — Progress Notes (Signed)
 Chief Complaint  Patient presents with   Ingrown Toenail    Left ingrown toenail recheck.  Right hallux is ingrown-  would like same procedure as had on left.        HPI: Patient is 58 y.o. male who presents today for recheck of left great toe from an ingrown procedure last week. He relates his right great toenail has not improved and has gotten worse.  He would like the same procedure on the right as the left.  Relates the left is no longer painful.    Allergies  Allergen Reactions   Amoxil [Amoxicillin] Hives    Review of systems is negative except as noted in the HPI.  Denies nausea/ vomiting/ fevers/ chills or night sweats.   Denies difficulty breathing, denies calf pain or tenderness  Physical Exam  Patient is awake, alert, and oriented x 3.  In no acute distress.    Vascular status is intact with palpable pedal pulses DP and PT bilateral and capillary refill time less than 3 seconds bilateral.  No edema or erythema noted.   Neurological exam reveals epicritic and protective sensation grossly intact bilateral.   Dermatological exam reveals skin is supple and dry to bilateral feet.  No open lesions present.    Musculoskeletal exam: matrixectomy on the left great toe is healing well left. No redness, swelling or sign of infection present.  Improvement noted.    Right hallux nail appears ingrown on the borders.  Redness and swelling present with pain with pressure noted.     Assessment:   ICD-10-CM   1. Ingrown right greater toenail  L60.0      Follow up on left great toenail removal.   Plan: Left great toenail is healing well.  He will continue to soak as he is healing from the right ingrown toenail.  Procedure on the right ingrown is recommended today.    Patient agreed. Skin was prepped with alcohol and a local injection of lidocaine and Marcaine plain was infiltrated to anesthetize the toe. The toe was then prepped with Betadine and exsanguinated. The offending nail  border was removed and phenol applied to the exposed matrix tissue.  The area was then cleansed well with alcohol.  Antibiotic ointment and a dressing was then applied the tourniquet released  noting a prompt hyperemic response to the tip of the toe.  Oral and written instructions were dispensed and the patient was instructed on aftercare.  If there is any increased redness, swelling, drainage, pus or any other concerns arise, Kaiyan will call to be seen.

## 2024-07-09 ENCOUNTER — Telehealth: Payer: Self-pay | Admitting: Adult Health

## 2024-07-09 NOTE — Telephone Encounter (Signed)
 Pt called to cancel  appt due to not needing to see MD at time   Appt Canceled

## 2024-08-13 ENCOUNTER — Ambulatory Visit: Admitting: Adult Health
